# Patient Record
Sex: Female | Born: 1970 | Race: White | Hispanic: No | Marital: Married | Smoking: Never smoker
Health system: Southern US, Community
[De-identification: ages and names within clinical notes are randomized; demographics above are authoritative.]

## PROBLEM LIST (undated history)

## (undated) DIAGNOSIS — B029 Zoster without complications: Secondary | ICD-10-CM

## (undated) DIAGNOSIS — F32A Depression, unspecified: Secondary | ICD-10-CM

## (undated) DIAGNOSIS — L511 Stevens-Johnson syndrome: Secondary | ICD-10-CM

## (undated) DIAGNOSIS — J45909 Unspecified asthma, uncomplicated: Secondary | ICD-10-CM

## (undated) DIAGNOSIS — T7840XA Allergy, unspecified, initial encounter: Secondary | ICD-10-CM

## (undated) DIAGNOSIS — M419 Scoliosis, unspecified: Secondary | ICD-10-CM

## (undated) DIAGNOSIS — F329 Major depressive disorder, single episode, unspecified: Secondary | ICD-10-CM

## (undated) DIAGNOSIS — F419 Anxiety disorder, unspecified: Secondary | ICD-10-CM

## (undated) HISTORY — PX: EYE SURGERY: SHX253

## (undated) HISTORY — PX: CHOLECYSTECTOMY: SHX55

## (undated) HISTORY — PX: OTHER SURGICAL HISTORY: SHX169

## (undated) HISTORY — PX: WISDOM TOOTH EXTRACTION: SHX21

## (undated) HISTORY — DX: Anxiety disorder, unspecified: F41.9

## (undated) HISTORY — DX: Scoliosis, unspecified: M41.9

## (undated) HISTORY — DX: Major depressive disorder, single episode, unspecified: F32.9

## (undated) HISTORY — DX: Allergy, unspecified, initial encounter: T78.40XA

## (undated) HISTORY — DX: Unspecified asthma, uncomplicated: J45.909

## (undated) HISTORY — DX: Stevens-Johnson syndrome: L51.1

## (undated) HISTORY — DX: Zoster without complications: B02.9

## (undated) HISTORY — DX: Depression, unspecified: F32.A

---

## 2015-04-06 ENCOUNTER — Ambulatory Visit (INDEPENDENT_AMBULATORY_CARE_PROVIDER_SITE_OTHER): Payer: 59 | Admitting: Family Medicine

## 2015-04-06 ENCOUNTER — Ambulatory Visit: Payer: Self-pay | Admitting: Internal Medicine

## 2015-04-06 ENCOUNTER — Ambulatory Visit (INDEPENDENT_AMBULATORY_CARE_PROVIDER_SITE_OTHER): Payer: 59

## 2015-04-06 ENCOUNTER — Encounter: Payer: Self-pay | Admitting: Family Medicine

## 2015-04-06 VITALS — BP 130/80 | HR 91 | Temp 98.5°F | Resp 16 | Ht 66.5 in | Wt 250.6 lb

## 2015-04-06 DIAGNOSIS — M2550 Pain in unspecified joint: Secondary | ICD-10-CM | POA: Diagnosis not present

## 2015-04-06 DIAGNOSIS — N921 Excessive and frequent menstruation with irregular cycle: Secondary | ICD-10-CM | POA: Diagnosis not present

## 2015-04-06 DIAGNOSIS — R0781 Pleurodynia: Secondary | ICD-10-CM | POA: Diagnosis not present

## 2015-04-06 DIAGNOSIS — R05 Cough: Secondary | ICD-10-CM

## 2015-04-06 DIAGNOSIS — F411 Generalized anxiety disorder: Secondary | ICD-10-CM | POA: Diagnosis not present

## 2015-04-06 DIAGNOSIS — R059 Cough, unspecified: Secondary | ICD-10-CM

## 2015-04-06 DIAGNOSIS — D899 Disorder involving the immune mechanism, unspecified: Secondary | ICD-10-CM

## 2015-04-06 DIAGNOSIS — J069 Acute upper respiratory infection, unspecified: Secondary | ICD-10-CM | POA: Diagnosis not present

## 2015-04-06 DIAGNOSIS — M609 Myositis, unspecified: Secondary | ICD-10-CM | POA: Diagnosis not present

## 2015-04-06 DIAGNOSIS — J4521 Mild intermittent asthma with (acute) exacerbation: Secondary | ICD-10-CM | POA: Diagnosis not present

## 2015-04-06 DIAGNOSIS — IMO0001 Reserved for inherently not codable concepts without codable children: Secondary | ICD-10-CM

## 2015-04-06 DIAGNOSIS — R21 Rash and other nonspecific skin eruption: Secondary | ICD-10-CM

## 2015-04-06 DIAGNOSIS — M791 Myalgia: Secondary | ICD-10-CM | POA: Diagnosis not present

## 2015-04-06 DIAGNOSIS — N920 Excessive and frequent menstruation with regular cycle: Secondary | ICD-10-CM | POA: Diagnosis not present

## 2015-04-06 DIAGNOSIS — R5382 Chronic fatigue, unspecified: Secondary | ICD-10-CM

## 2015-04-06 LAB — COMPREHENSIVE METABOLIC PANEL
ALBUMIN: 4.3 g/dL (ref 3.6–5.1)
ALT: 19 U/L (ref 6–29)
AST: 16 U/L (ref 10–30)
Alkaline Phosphatase: 77 U/L (ref 33–115)
BUN: 9 mg/dL (ref 7–25)
CALCIUM: 9 mg/dL (ref 8.6–10.2)
CHLORIDE: 105 mmol/L (ref 98–110)
CO2: 25 mmol/L (ref 20–31)
Creat: 0.59 mg/dL (ref 0.50–1.10)
Glucose, Bld: 94 mg/dL (ref 65–99)
POTASSIUM: 4.6 mmol/L (ref 3.5–5.3)
SODIUM: 138 mmol/L (ref 135–146)
Total Bilirubin: 0.4 mg/dL (ref 0.2–1.2)
Total Protein: 6.9 g/dL (ref 6.1–8.1)

## 2015-04-06 LAB — RHEUMATOID FACTOR

## 2015-04-06 LAB — C-REACTIVE PROTEIN: CRP: 2.1 mg/dL — ABNORMAL HIGH (ref <0.60)

## 2015-04-06 LAB — CBC WITH DIFFERENTIAL/PLATELET
BASOS ABS: 0.1 10*3/uL (ref 0.0–0.1)
BASOS PCT: 1 % (ref 0–1)
EOS ABS: 0.1 10*3/uL (ref 0.0–0.7)
EOS PCT: 1 % (ref 0–5)
HCT: 38.5 % (ref 36.0–46.0)
Hemoglobin: 12.5 g/dL (ref 12.0–15.0)
Lymphocytes Relative: 25 % (ref 12–46)
Lymphs Abs: 2.1 10*3/uL (ref 0.7–4.0)
MCH: 28.5 pg (ref 26.0–34.0)
MCHC: 32.5 g/dL (ref 30.0–36.0)
MCV: 87.7 fL (ref 78.0–100.0)
MONOS PCT: 5 % (ref 3–12)
MPV: 8.9 fL (ref 8.6–12.4)
Monocytes Absolute: 0.4 10*3/uL (ref 0.1–1.0)
NEUTROS PCT: 68 % (ref 43–77)
Neutro Abs: 5.6 10*3/uL (ref 1.7–7.7)
PLATELETS: 534 10*3/uL — AB (ref 150–400)
RBC: 4.39 MIL/uL (ref 3.87–5.11)
RDW: 14.5 % (ref 11.5–15.5)
WBC: 8.2 10*3/uL (ref 4.0–10.5)

## 2015-04-06 LAB — CK: Total CK: 46 U/L (ref 7–177)

## 2015-04-06 LAB — THYROID PANEL WITH TSH
Free Thyroxine Index: 2.1 (ref 1.4–3.8)
T3 Uptake: 29 % (ref 22–35)
T4, Total: 7.2 ug/dL (ref 4.5–12.0)
TSH: 1.414 u[IU]/mL (ref 0.350–4.500)

## 2015-04-06 LAB — URIC ACID: URIC ACID, SERUM: 4.1 mg/dL (ref 2.4–7.0)

## 2015-04-06 LAB — LIPID PANEL
Cholesterol: 109 mg/dL — ABNORMAL LOW (ref 125–200)
HDL: 51 mg/dL (ref >=46)
LDL CALC: 33 mg/dL (ref <130)
Total CHOL/HDL Ratio: 2.1 Ratio (ref <=5.0)
Triglycerides: 125 mg/dL (ref <150)
VLDL: 25 mg/dL (ref <30)

## 2015-04-06 LAB — LACTATE DEHYDROGENASE: LDH: 83 U/L — ABNORMAL LOW (ref 94–250)

## 2015-04-06 LAB — IRON: Iron: 91 ug/dL (ref 40–190)

## 2015-04-06 LAB — VITAMIN B12: Vitamin B-12: 434 pg/mL (ref 211–911)

## 2015-04-06 LAB — FERRITIN: FERRITIN: 26 ng/mL (ref 10–291)

## 2015-04-06 MED ORDER — ESCITALOPRAM OXALATE 20 MG PO TABS: 20.0000 mg | ORAL_TABLET | Freq: Every day | ORAL | Status: DC

## 2015-04-06 MED ORDER — AEROCHAMBER PLUS MISC: Status: DC

## 2015-04-06 MED ORDER — AZITHROMYCIN 250 MG PO TABS: ORAL_TABLET | ORAL | Status: DC

## 2015-04-06 MED ORDER — ACETAMINOPHEN-CODEINE #3 300-30 MG PO TABS: 1.0000 | ORAL_TABLET | ORAL | Status: DC | PRN

## 2015-04-06 MED ORDER — BUDESONIDE-FORMOTEROL FUMARATE 160-4.5 MCG/ACT IN AERO: 1.0000 | INHALATION_SPRAY | Freq: Two times a day (BID) | RESPIRATORY_TRACT | Status: DC

## 2015-04-06 MED ORDER — ALBUTEROL SULFATE HFA 108 (90 BASE) MCG/ACT IN AERS: 2.0000 | INHALATION_SPRAY | RESPIRATORY_TRACT | Status: DC | PRN

## 2015-04-06 MED ORDER — BENZONATATE 200 MG PO CAPS: 200.0000 mg | ORAL_CAPSULE | Freq: Three times a day (TID) | ORAL | Status: DC | PRN

## 2015-04-06 NOTE — Patient Instructions (Addendum)
Because you received an x-ray today, you will receive an invoice from [facility]. Please contact [facility] at [phone] with questions or concerns regarding your invoice. Our billing staff will not be able to assist you with those questions.  If you get any odd rashes, do let me see. Lets hold off on the prednisone since you have an upcoming appointment with ENT - start the zpack but if you are still coughing and feel tight, call and we can to a lower dose prednisone burst.

## 2015-04-06 NOTE — Progress Notes (Signed)
Subjective:    Patient ID: Mia Thomas, female    DOB: [birth year], 45 y.o.   MRN: [id number] Chief Complaint  Patient presents with  . new pt, estab care  . Asthma    HPI  Mia Thomas is an absolutely delightful 45 yo woman here today to est care.  She moved to [place] with her family about 6 mos ago from [place] - her husband is a GI doctor that has started with [facility].    For the past several months, since around 12/15, she has only had a total of 2 weeks of feeling well - recurrent URIs with cough- which is unfortunately not uncommon for her during the winter.  Initially her sxs started with severe pharyngitis and laryngitis initially, no fevers.  Now for the past sev weeks she has had worsening sensation of swelling in her throat - feels very tight and inducing a dry upper airway hacking cough.  Albuterol has not helped at all so not using.  Coughing so much she feels like she broke a rib which has actually happened to her prev.  Cough keeping her awake so hasn't had a good night sleep in 6 wks. Did have one tylenol #3 left which she took before bed sev nights ago and did sleep much better with reduced o/n cough with that.  She does have a h/o asthma/bronchospams that flairs with URIs only.  Between episodes of acute illness, she is completely asymptomatic from her asthma and req no inhaler use.  She uses symbicort but does not feel any difference at all or reduction in sxs when on it. Ran out several wks prior and cough has not worsened since then - just persisted. Has never been on any other type of maintenance inhaler.  Tries to avoid systemic steroid treatment possible as does sig worsen her anxiety, irritability, insomnia.  Singulair does help so takes regularly, cetirizine and other antihistamines don't help.  Did have prior prolonged episode like this 2 years ago as well - finally sought treatment and quickly responded to a zpack.  There was a concern at that time that she could have had  pertussis despite tdap fairly recently prior (1-2 yrs). Req new spacer with alb refill. Has neb machine at home with plenty of alb solution. Son has more severe asthma.  Over the summer during the move she had an outbreak of bilateral shingles over T7-8 dermatome areas treated with valtrex but bounced between either side for a while . She has had similar shingles episodes sev times prior as well.  Has felt like she has always had a bad immune system as she gets URIs many times each year and very atopic.    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  Her husband has alwasys suspected that she may have some sort of immunodeficiency such as IgA def or an odd mixed connective tissue d/o but she has never pursued actual w/u of this. Pt and her husband both state mult times that she "hates going to the doctor" and so things have to pretty severe before she will seek treatment.  She has never seen ENT but does have an appt with Dr Janace Hoard at [facility] next wk as her husband is concerned that she could be having laryngeal irritation chronically - poss from childhood intubation?  Many times her cough seems to come more from  her throat rather than asthma/bronchospasm/bronchitis flair.   Mia Thomas admits that she has seemed to havea variety of rheumatologic symptoms throughout life that have become progressively more freq.  She will often wake up with the joints in her fingers swollen, red, stiff.  After her prolonged bilateral shingles outbreak resolved 4-6 mos ago, she then developed severe weakness and pain in bilateral proximal upper ext - couldn't abduct her arms past 75 deg for several days.  Throughout life she has been prone to a variety of rashes - -several years ago she had a circular rash than ran linearly around her abdomen - looked like a chain-belt - saw dermatology who didn't know  what it was - biopsy was done that showed some non-specific inflammatory change.  Gets an intermittent leathery red scaly rash at different places - currently has a small patch on her right lateral ankle and is sometimes connected with an intense neuropathic pain.  When she gets sick she will get pustules pop out randomly over body - assumes in MRSA - has never tried anything for it but would like to.  Her daughter Mia Thomas had to see immunologist and be revaccinated for ALL of her childhood vaccines as an adolescent as she did not have significant antibody levels for ANYTHING.  Jenna's mom has recently been diagnosed with dermatomyositis - fortunately it is very well controlled with diet/lifestyle and hasn't required medication treatment.    Does take a lot of advil for her joint pains but no other otc meds, supps, herbals, vit.  No gerd/Gi problems.  Does better when she swims for exercise.  Menses are terrible. Small uterine cyst.  IUD went horribly and made sxs  Much worse  Frustrated by weight gain despite very healthy diet.  Recurrent illnesses and cough has kept her from doing appropriate self-care like sleep and exercise.  Eats a 5 oz tub of baby spinach DAILY - puts it in EVERYTHING - and eats a ton of kale.  Plenty of beans and cereal. No red meat as primarily vegetarian but just added back in fish.  Has 3 kids and 3 dogs. Her eldest son 48 yo is in college. Daughter is at [employer] of science and math - 78 yo. Youngest son - 29 yo- is still at home - I met him prior during a rather severe asthma exac and he has an appointment with me to est in sev wks as well.   Used to be a Oncologist.  Past Medical History  Diagnosis Date  . Allergy   . Anxiety   . Asthma   . Depression    Past Surgical History  Procedure Laterality Date  . Cholecystectomy    . Cesarean section    . Knee arthroscopy x 2    . Eye surgery      repair corneal tare   No current outpatient prescriptions  on file prior to visit.   No current facility-administered medications on file prior to visit.   Allergies  Allergen Reactions  . Sulfa Antibiotics     Remo Lipps Johnson"s syndrome   Family History  Problem Relation Age of Onset  . Cancer Mother     skin  . Cancer Father     skin   Social History   Social History  . Marital Status: Married    Spouse Name: N/A  . Number of Children: N/A  . Years of Education: N/A   Social History Main Topics  . Smoking status: Never Smoker   .  Smokeless tobacco: None  . Alcohol Use: No  . Drug Use: No  . Sexual Activity: Not Asked   Other Topics Concern  . None   Social History Narrative  . None     Review of Systems  Constitutional: Positive for activity change, fatigue and unexpected weight change. Negative for fever, chills, diaphoresis and appetite change.  HENT: Positive for voice change. Negative for congestion, ear discharge, ear pain, mouth sores, nosebleeds, postnasal drip, rhinorrhea, sinus pressure, sneezing, sore throat and trouble swallowing.   Eyes: Positive for photophobia. Negative for pain.  Respiratory: Positive for cough and stridor. Negative for choking, chest tightness, shortness of breath and wheezing.   Cardiovascular: Negative for chest pain and leg swelling.  Gastrointestinal: Negative for nausea, vomiting and abdominal pain.  Genitourinary: Positive for vaginal bleeding, menstrual problem and pelvic pain. Negative for dysuria and difficulty urinating.  Musculoskeletal: Positive for myalgias, joint swelling and arthralgias. Negative for gait problem, neck pain and neck stiffness.  Skin: Positive for rash.  Allergic/Immunologic: Positive for environmental allergies and immunocompromised state. Negative for food allergies.  Neurological: Positive for weakness. Negative for dizziness and syncope.  Hematological: Positive for adenopathy.  Psychiatric/Behavioral: Positive for sleep disturbance.       Objective:    BP 130/80 mmHg  Pulse 91  Temp(Src) 98.5 F (36.9 C) (Oral)  Resp 16  Ht 5' 6.5" (1.689 m)  Wt 250 lb 9.6 oz (113.671 kg)  BMI 39.85 kg/m2  SpO2 98%  LMP 03/19/2015  Physical Exam  Constitutional: She is oriented to person, place, and time. She appears well-developed and well-nourished. No distress.  HENT:  Head: Normocephalic and atraumatic.  Right Ear: External ear and ear canal normal. Tympanic membrane is retracted.  Left Ear: External ear and ear canal normal. Tympanic membrane is retracted.  Nose: Nose normal. No mucosal edema or rhinorrhea.  Mouth/Throat: Uvula is midline and mucous membranes are normal. Posterior oropharyngeal erythema present. No oropharyngeal exudate or posterior oropharyngeal edema.  Eyes: Conjunctivae are normal. Right eye exhibits no discharge. Left eye exhibits no discharge. No scleral icterus.  Neck: Normal range of motion. Neck supple.  Cardiovascular: Normal rate, regular rhythm, normal heart sounds and intact distal pulses.   Pulmonary/Chest: Effort normal and breath sounds normal. She exhibits tenderness and bony tenderness.    Abdominal: Soft. Bowel sounds are normal. She exhibits no distension and no mass. There is no tenderness. There is no rebound and no guarding.  Lymphadenopathy:       Head (right side): No submandibular, no preauricular and no posterior auricular adenopathy present.       Head (left side): No submandibular, no preauricular and no posterior auricular adenopathy present.    She has cervical adenopathy.       Right cervical: Superficial cervical adenopathy present. No posterior cervical adenopathy present.      Left cervical: Superficial cervical adenopathy present. No posterior cervical adenopathy present.       Right: No supraclavicular adenopathy present.       Left: No supraclavicular adenopathy present.  Neurological: She is alert and oriented to person, place, and time.  Skin: Skin is warm and dry. No rash noted. She  is not diaphoretic. No erythema.  Psychiatric: She has a normal mood and affect. Her behavior is normal.      Dg Ribs Unilateral W/chest Right  04/06/2015  CLINICAL DATA:  Right-sided rib pain after coughing for 2 months. EXAM: RIGHT RIBS AND CHEST - 3+ VIEW COMPARISON:  None.  FINDINGS: No fracture or other bone lesions are seen involving the ribs. There is no evidence of pneumothorax or pleural effusion. Both lungs are clear. Heart size and mediastinal contours are within normal limits. IMPRESSION: Normal right ribs.  No acute cardiopulmonary abnormality seen. Electronically Signed   By: Marijo Conception, M.D.   On: 04/06/2015 12:01       Assessment & Plan:   1. Acute upper respiratory infection - has had prolonged illness with secondary (or maybe even tertiary or quaternary) worsening so will treat with zpack  2. Rib pain on right side - no fracture seen but clearly with severe pain - brace with coughing and deep breathing, exquisitely tender so prn tylenol #3.  3. Asthma, chronic, mild intermittent, with acute exacerbation - current asthma not exac but do have low threshhold for addingon low dose prednisone should sxs persists.  Cont singulair, not currently needing symbicort - refilled but encouraged pt to consider changing to another product if she does not find she is getting any current sig benefit from symbicort.  4. Myalgia and myositis   5. Cough   6. Chronic fatigue   7. Anxiety state - does well on lexapro, rarely uses prn ativan - declines refill today  8. 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 Very odd that none of her daughter's childhood vaccines induced adequate response. Suspect dermatomyositis since was recently diagnosed in her mother.  Will start detailed lab w/u and will likely need immunology or  rheumatology referral - will try Dr. Estanislado Pandy.  9. Rash and nonspecific skin eruption - use nasal bacroban and daily hibiclens body wash x 5d to attempt mrsa eradication  10. Arthralgia   11. Menometrorrhagia     Orders Placed This Encounter  Procedures  . DG Ribs Unilateral W/Chest Right    Standing Status: Future     Number of Occurrences: 1     Standing Expiration Date: 04/05/2016    Order Specific Question:  Reason for Exam (SYMPTOM  OR DIAGNOSIS REQUIRED)    Answer:  anterior right rib pain after cough x 2 mos    Order Specific Question:  Is the patient pregnant?    Answer:  No    Order Specific Question:  Preferred imaging location?    Answer:  External  . C-reactive protein  . Comprehensive metabolic panel  . CK  . ANA  . IgG, IgA, IgM  . IgE  . Rheumatoid factor  . Uric Acid  . CBC with Differential/Platelet  . Sedimentation Rate  . Pathologist smear review  . Vitamin B12  . VITAMIN D 25 Hydroxy (Vit-D Deficiency, Fractures)  . Thyroid Panel With TSH  . Lipid panel    Order Specific Question:  Has the patient fasted?    Answer:  Yes  . Ferritin  . Iron  . Aldolase  . Lactate Dehydrogenase    Meds ordered this encounter  Medications  . DISCONTD: escitalopram (LEXAPRO) 20 MG tablet    Sig: Take 20 mg by mouth daily.  . montelukast (SINGULAIR) 10 MG tablet    Sig: Take 10 mg by mouth at bedtime.  Marland Kitchen escitalopram (LEXAPRO) 20 MG tablet    Sig: Take 1 tablet (20 mg total) by mouth daily.    Dispense:  90 tablet    Refill:  3  . LORazepam (ATIVAN) 1 MG  tablet    Sig: Take 1 mg by mouth every 8 (eight) hours.  Marland Kitchen albuterol (PROVENTIL HFA;VENTOLIN HFA) 108 (90 Base) MCG/ACT inhaler    Sig: Inhale 2 puffs into the lungs every 4 (four) hours as needed for wheezing or shortness of breath (cough, shortness of breath or wheezing.).    Dispense:  1 Inhaler    Refill:  11  . Spacer/Aero-Holding Chambers (AEROCHAMBER PLUS) inhaler    Sig: Use as instructed    Dispense:   1 each    Refill:  2    Please dispense any type of spacer that work with the albuterol that pt is being prescribed  . azithromycin (ZITHROMAX) 250 MG tablet    Sig: Take 2 tabs PO x 1 dose, then 1 tab PO QD x 4 days    Dispense:  6 tablet    Refill:  0  . acetaminophen-codeine (TYLENOL #3) 300-30 MG tablet    Sig: Take 1 tablet by mouth every 4 (four) hours as needed for moderate pain (or cough).    Dispense:  60 tablet    Refill:  0  . budesonide-formoterol (SYMBICORT) 160-4.5 MCG/ACT inhaler    Sig: Inhale 1 puff into the lungs 2 (two) times daily.    Dispense:  1 Inhaler    Refill:  3  . benzonatate (TESSALON) 200 MG capsule    Sig: Take 1 capsule (200 mg total) by mouth 3 (three) times daily as needed for cough.    Dispense:  60 capsule    Refill:  1   Over 45 min spent in face-to-face evaluation of and consultation with patient and coordination of care.  Over 50% of this time was spent counseling this patient.  Delman Cheadle, MD MPH

## 2015-04-07 LAB — VITAMIN D 25 HYDROXY (VIT D DEFICIENCY, FRACTURES): Vit D, 25-Hydroxy: 32 ng/mL (ref 30–100)

## 2015-04-07 LAB — IGG, IGA, IGM
IgA: 316 mg/dL (ref 69–380)
IgG (Immunoglobin G), Serum: 1010 mg/dL (ref 690–1700)
IgM, Serum: 96 mg/dL (ref 52–322)

## 2015-04-07 LAB — SEDIMENTATION RATE: SED RATE: 18 mm/h (ref 0–20)

## 2015-04-07 LAB — PATHOLOGIST SMEAR REVIEW

## 2015-04-07 LAB — ANA: ANA: NEGATIVE

## 2015-04-07 LAB — IGE: IgE (Immunoglobulin E), Serum: 39 kU/L (ref <115)

## 2015-04-08 MED ORDER — MUPIROCIN 2 % EX OINT: TOPICAL_OINTMENT | CUTANEOUS | Status: DC

## 2015-04-08 MED ORDER — CHLORHEXIDINE GLUCONATE 4 % EX LIQD: CUTANEOUS | Status: DC

## 2015-04-10 ENCOUNTER — Encounter: Payer: Self-pay | Admitting: Family Medicine

## 2015-04-10 DIAGNOSIS — R490 Dysphonia: Secondary | ICD-10-CM | POA: Diagnosis not present

## 2015-04-10 DIAGNOSIS — R061 Stridor: Secondary | ICD-10-CM | POA: Diagnosis not present

## 2015-04-10 DIAGNOSIS — K219 Gastro-esophageal reflux disease without esophagitis: Secondary | ICD-10-CM | POA: Diagnosis not present

## 2015-04-10 LAB — ALDOLASE: ALDOLASE: 4.3 U/L (ref <=8.1)

## 2015-04-13 ENCOUNTER — Telehealth: Payer: Self-pay

## 2015-04-20 ENCOUNTER — Other Ambulatory Visit: Payer: Self-pay | Admitting: Family Medicine

## 2015-04-20 MED ORDER — ESCITALOPRAM OXALATE 20 MG PO TABS: 20.0000 mg | ORAL_TABLET | Freq: Every day | ORAL | Status: DC

## 2015-04-20 MED ORDER — BUDESONIDE-FORMOTEROL FUMARATE 160-4.5 MCG/ACT IN AERO: 1.0000 | INHALATION_SPRAY | Freq: Two times a day (BID) | RESPIRATORY_TRACT | Status: DC

## 2015-05-29 ENCOUNTER — Other Ambulatory Visit: Payer: Self-pay | Admitting: Family Medicine

## 2015-05-29 MED ORDER — MONTELUKAST SODIUM 10 MG PO TABS: 10.0000 mg | ORAL_TABLET | Freq: Every day | ORAL | Status: DC

## 2015-09-06 ENCOUNTER — Telehealth: Payer: Self-pay | Admitting: Family Medicine

## 2015-09-06 NOTE — Telephone Encounter (Signed)
Apparently, [employer] insurance did not cover any of the labs done at pt's OV in February as they said the linked codes were not correct.  I would love to rectify this situation - as you see I have a plethora of diagnosis codes, as well as good medical reason, so figure that there must be some way of figuring this out.  Any recommendations for how to proceed?  Should I look in the medicare coverage guide to ensure they are covered in there? Any help would be [facility] appreciated - thanks. Harmon Pier

## 2015-09-21 NOTE — Telephone Encounter (Signed)
I will call to check this out when I get back into the office on Tuesday to see what is going on.

## 2015-10-02 NOTE — Telephone Encounter (Signed)
Ccan you please check into this? Thanks!

## 2015-10-05 DIAGNOSIS — M71341 Other bursal cyst, right hand: Secondary | ICD-10-CM | POA: Diagnosis not present

## 2015-10-05 DIAGNOSIS — D485 Neoplasm of uncertain behavior of skin: Secondary | ICD-10-CM | POA: Diagnosis not present

## 2015-10-05 DIAGNOSIS — D2272 Melanocytic nevi of left lower limb, including hip: Secondary | ICD-10-CM | POA: Diagnosis not present

## 2015-10-05 DIAGNOSIS — D2361 Other benign neoplasm of skin of right upper limb, including shoulder: Secondary | ICD-10-CM | POA: Diagnosis not present

## 2015-10-05 NOTE — Telephone Encounter (Signed)
I called [facility] and they said that insurance is pending on that date.  She has like 5 claims on that day.  I'm not sure what she is talking about.  She could be looking at her EOB which looks like a bill but is not.

## 2016-01-08 ENCOUNTER — Telehealth: Payer: Self-pay

## 2016-01-08 ENCOUNTER — Ambulatory Visit (INDEPENDENT_AMBULATORY_CARE_PROVIDER_SITE_OTHER): Payer: 59 | Admitting: Physician Assistant

## 2016-01-08 DIAGNOSIS — J45901 Unspecified asthma with (acute) exacerbation: Secondary | ICD-10-CM | POA: Diagnosis not present

## 2016-01-08 DIAGNOSIS — J309 Allergic rhinitis, unspecified: Secondary | ICD-10-CM | POA: Diagnosis not present

## 2016-01-08 DIAGNOSIS — J45909 Unspecified asthma, uncomplicated: Secondary | ICD-10-CM | POA: Insufficient documentation

## 2016-01-08 MED ORDER — HYDROCOD POLST-CPM POLST ER 10-8 MG/5ML PO SUER: 5.0000 mL | Freq: Two times a day (BID) | ORAL | 0 refills | Status: DC | PRN

## 2016-01-08 MED ORDER — AZITHROMYCIN 250 MG PO TABS: ORAL_TABLET | ORAL | 0 refills | Status: DC

## 2016-01-08 MED ORDER — PREDNISONE 20 MG PO TABS: ORAL_TABLET | ORAL | 0 refills | Status: DC

## 2016-01-08 MED ORDER — BENZONATATE 100 MG PO CAPS: 100.0000 mg | ORAL_CAPSULE | Freq: Three times a day (TID) | ORAL | 0 refills | Status: DC | PRN

## 2016-01-08 MED ORDER — BUDESONIDE 0.5 MG/2ML IN SUSP: 0.5000 mg | Freq: Two times a day (BID) | RESPIRATORY_TRACT | 0 refills | Status: DC | PRN

## 2016-01-08 MED ORDER — BUDESONIDE 0.25 MG/2ML IN SUSP: 0.2500 mg | Freq: Two times a day (BID) | RESPIRATORY_TRACT | 1 refills | Status: DC

## 2016-01-08 MED FILL — predniSONE 20 MG TABS: 20 | 9 days supply | Qty: 18 | Fill #0

## 2016-01-08 MED FILL — BENZONATATE 100 MG CAPSULE: 100 | 6 days supply | Qty: 40 | Fill #0

## 2016-01-08 MED FILL — AZITHROMYCIN 250 MG TABLET: 250 | 5 days supply | Qty: 6 | Fill #0

## 2016-01-08 NOTE — Telephone Encounter (Signed)
Patient is requesting .5mg  pulmicort since the pharmacy only has .5mg  in stock.  Please advise  [phone]

## 2016-01-08 NOTE — Progress Notes (Signed)
Subjective:    Patient ID: Mia Thomas, female    DOB: [birth year], 45 y.o.   MRN: [id number]  Chief Complaint  Patient presents with  . Asthma    X 2 Weeks    HPI: Presents for asthma flare which started 2 weeks ago. Associated symptoms include congestion, PND, cough, chest tightness, SOB, and wheezing. She states she always gets an asthma flare around this time of year ever since they moved to [facility] from [place], especially with all the large trees around their current house. States she has been using Zyrtec, Mucinex DM, Symbicort, Singulair, and Flonase at home with some relief but over the past 3 days her symptoms have been worsening and she has had some low-grade fevers (highest of 100.3) along with non-stop coughing and increasing wheezing. Coughing is much worse at night and prevents her from sleeping well. States she has had to use her nebulizer 3x in the past 2 weeks which is much more than she usually does. Notes 1 episode of non-bloody emesis during the night from coughing so much. Notes some associated headaches for which she has been taking Advil and Tylenol and they are pretty well-controlled with those medications. Denies ear pain/discharge, eye redness/itching/dicharge, sinus pressure, sore throat, or sneezing. States she has not yet had the flu vaccine. Daughter in highschool recently diagnosed with Pertussis.    Review of Systems  HENT: Positive for congestion and postnasal drip. Negative for ear discharge, ear pain, sinus pain, sinus pressure, sneezing, sore throat, tinnitus and trouble swallowing.   Eyes: Negative for pain, discharge, redness and itching.  Respiratory: Positive for cough, chest tightness, shortness of breath and wheezing. Negative for apnea and choking.   Cardiovascular: Negative for chest pain and palpitations.  Gastrointestinal: Positive for vomiting. Negative for abdominal pain, blood in stool, constipation, diarrhea and nausea.  Genitourinary: Negative  for dysuria, frequency, hematuria and urgency.  Musculoskeletal: Negative for joint swelling and myalgias.  Skin: Negative for rash.  Neurological: Positive for headaches. Negative for dizziness, syncope, weakness and light-headedness.  Hematological: Negative for adenopathy.  Psychiatric/Behavioral: Negative for dysphoric mood.   Allergies  Allergen Reactions  . Sulfa Antibiotics     Remo Lipps Johnson"s syndrome   Prior to Admission medications   Medication Sig Start Date End Date Taking? Authorizing Provider  albuterol (PROVENTIL HFA;VENTOLIN HFA) 108 (90 Base) MCG/ACT inhaler Inhale 2 puffs into the lungs every 4 (four) hours as needed for wheezing or shortness of breath (cough, shortness of breath or wheezing.). 04/06/15  Yes Shawnee Knapp, MD  budesonide-formoterol [facility]) 160-4.5 MCG/ACT inhaler Inhale 1 puff into the lungs 2 (two) times daily. 04/20/15  Yes Shawnee Knapp, MD  cetirizine (ZYRTEC) 10 MG tablet Take 10 mg by mouth daily.   Yes Historical Provider, MD  escitalopram (LEXAPRO) 20 MG tablet Take 1 tablet (20 mg total) by mouth daily. 04/20/15  Yes Shawnee Knapp, MD  fluticasone (FLONASE) 50 MCG/ACT nasal spray Place 1 spray into both nostrils daily.   Yes Historical Provider, MD  montelukast (SINGULAIR) 10 MG tablet Take 1 tablet (10 mg total) by mouth at bedtime. 05/29/15  Yes Shawnee Knapp, MD  Spacer/Aero-Holding Chambers (AEROCHAMBER PLUS) inhaler Use as instructed 04/06/15  Yes Shawnee Knapp, MD      Objective:   Physical Exam  Constitutional: She is oriented to person, place, and time. She appears well-developed and well-nourished.  HENT:  Head: Normocephalic and atraumatic.  Right Ear: External ear normal. No drainage, swelling or  tenderness. Tympanic membrane is not injected, not scarred, not perforated, not erythematous, not retracted and not bulging. Tympanic membrane mobility is normal. No middle ear effusion. No hemotympanum.  Left Ear: External ear normal. No drainage, swelling  or tenderness. Tympanic membrane is not injected, not scarred, not perforated, not erythematous, not retracted and not bulging. Tympanic membrane mobility is normal.  No middle ear effusion. No hemotympanum.  Nose: No mucosal edema, rhinorrhea, sinus tenderness, septal deviation or nasal septal hematoma. Right sinus exhibits no maxillary sinus tenderness and no frontal sinus tenderness. Left sinus exhibits no maxillary sinus tenderness and no frontal sinus tenderness.  Mouth/Throat: Uvula is midline and oropharynx is clear and moist. Mucous membranes are not pale and not dry. Normal dentition. No oropharyngeal exudate or posterior oropharyngeal erythema.  Eyes: Conjunctivae are normal. Pupils are equal, round, and reactive to light. Right eye exhibits no discharge. Left eye exhibits no discharge. Right conjunctiva is not injected. Left conjunctiva is not injected. No scleral icterus. Right pupil is round and reactive. Left pupil is round and reactive.  Neck: Normal range of motion. Neck supple. No tracheal deviation present.  Cardiovascular: Normal rate, regular rhythm, normal heart sounds and intact distal pulses.  Exam reveals no gallop and no friction rub.   No murmur heard. Pulmonary/Chest: Effort normal and breath sounds normal. No stridor. No respiratory distress. She has no decreased breath sounds. She has no wheezes. She has no rhonchi. She has no rales.  Lymphadenopathy:    She has no cervical adenopathy.  Neurological: She is alert and oriented to person, place, and time.  Skin: Skin is warm and dry. No rash noted. No erythema.  Psychiatric: She has a normal mood and affect. Her behavior is normal.       Assessment & Plan:  1. Asthma with acute exacerbation, unspecified asthma severity, unspecified whether persistent Instructed patient to first use nebulized Budesonide 2 mLs (0.5 mg) BID and if this does not work then to use the Prednisone and then the Azithromycin. Discussed plan with  patient and her husband whom is a physician and they expressed understanding. Rx of Hydrocodone to be used as needed at night for cough as well as Tessalon perles. Advised patient to RTC if symptoms are not improving with medications or worsening. - azithromycin (ZITHROMAX) 250 MG tablet; Take 2 tabs PO x 1 dose, then 1 tab PO QD x 4 days  Dispense: 6 tablet; Refill: 0 - benzonatate (TESSALON) 100 MG capsule; Take 1-2 capsules (100-200 mg total) by mouth 3 (three) times daily as needed for cough.  Dispense: 40 capsule; Refill: 0 - chlorpheniramine-HYDROcodone (TUSSIONEX PENNKINETIC ER) 10-8 MG/5ML SUER; Take 5 mLs by mouth every 12 (twelve) hours as needed for cough.  Dispense: 100 mL; Refill: 0 - predniSONE (DELTASONE) 20 MG tablet; Take 3 PO QAM x3days, 2 PO QAM x3days, 1 PO QAM x3days  Dispense: 18 tablet; Refill: 0 - budesonide (PULMICORT) 0.5 MG/2ML nebulizer solution; Take 2 mLs (0.5 mg total) by nebulization 2 (two) times daily as needed.  Dispense: 120 mL; Refill: 0  2. Chronic allergic rhinitis, unspecified seasonality, unspecified trigger Asthma exacerbation likely due to allergic rhinitis, see above.

## 2016-01-08 NOTE — Progress Notes (Signed)
Patient ID: Mia Thomas, female    DOB: 1970/04/07, 45 y.o.   MRN: [id number]  PCP: No primary care provider on file.  Subjective:   Chief Complaint  Patient presents with  . Asthma    X 2 Weeks     HPI Presents for evaluation of asthma flare. She is accompanied by her husband, [name].  This flare began 2 weeks ago. Typically experiences a flare of her allergies and asthma this time of year, since her family relocated to [facility] from [place].  Her routine medications are providing only minimal relief of her symptoms and she relates low grade fever (100.3), increased "non-stop" coughing (non-productive) and increased wheezing x 3 days. Cough is disrupting her sleep.   Has used albuterol nebs x3 in the past 2 weeks. One episode of post-tussive emesis, but no other nausea or vomiting. Some headache, relieved with OTC NSAIDS. No nasal/dsinus congestion or pressure, no ST, no ear pain, no eye symptoms. One of her 3 children, who attends a residential high school in [place], [place], was recently treated for pertussis. She has not yet received a flu vaccine this season.  In the past, she's been able to avoid needing oral steroids and antibiotics by using nebulized budesonide.  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 8th grader at [employer]: Positive for congestion and postnasal drip. Negative for ear discharge, ear pain, sinus pain, sinus pressure, sneezing, sore throat, tinnitus and trouble swallowing.   Eyes: Negative for pain, discharge, redness and itching.  Respiratory: Positive for cough, chest tightness, shortness of breath and wheezing. Negative for apnea and choking.   Cardiovascular: Negative for chest pain and palpitations.  Gastrointestinal: Positive for vomiting. Negative for abdominal pain, blood in stool, constipation, diarrhea  and nausea.  Genitourinary: Negative for dysuria, frequency, hematuria and urgency.  Musculoskeletal: Negative for joint swelling and myalgias.  Skin: Negative for rash.  Neurological: Positive for headaches. Negative for dizziness, syncope, weakness and light-headedness.  Hematological: Negative for adenopathy.  Psychiatric/Behavioral: Negative for dysphoric mood.     Patient Active Problem List   Diagnosis Date Noted  . 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 Allergic rhinitis 01/08/2016     Prior to Admission medications   Medication Sig Start Date End Date Taking? Authorizing Provider  albuterol (PROVENTIL HFA;VENTOLIN HFA) 108 (90 Base) MCG/ACT inhaler Inhale 2 puffs into the lungs every 4 (four) hours as needed for wheezing or shortness of breath (cough, shortness of breath or wheezing.). 04/06/15  Yes Shawnee Knapp, MD  budesonide-formoterol [facility]) 160-4.5 MCG/ACT inhaler Inhale 1 puff into the lungs 2 (two) times daily. 04/20/15  Yes Shawnee Knapp, MD  cetirizine (ZYRTEC) 10 MG tablet Take 10 mg by mouth daily.   Yes Historical Provider, MD  escitalopram (LEXAPRO) 20 MG tablet Take 1 tablet (20 mg total) by mouth daily. 04/20/15  Yes Shawnee Knapp, MD  fluticasone (FLONASE) 50 MCG/ACT nasal spray Place 1 spray into both nostrils daily.   Yes Historical Provider, MD  montelukast (SINGULAIR) 10 MG tablet Take 1 tablet (10 mg total) by mouth at bedtime. 05/29/15  Yes Shawnee Knapp, MD  Spacer/Aero-Holding Chambers (AEROCHAMBER PLUS) inhaler Use as instructed 04/06/15  Yes Shawnee Knapp, MD     Allergies  Allergen Reactions  . Sulfa Antibiotics     Remo Lipps Johnson"s syndrome  Objective:  Physical Exam  Constitutional: She is oriented to person, place, and time. She appears well-developed and well-nourished. She is active and cooperative. No distress.  BP 132/76 (BP Location: Right Arm, Patient Position: Sitting, Cuff Size: Large)   Pulse 99   Temp 98.2 F (36.8 C) (Oral)   Resp 16   Ht 5'  6" (1.676 m)   Wt 254 lb (115.2 kg)   LMP 01/08/2016 (Exact Date)   SpO2 97%   BMI 41.00 kg/m   HENT:  Head: Normocephalic and atraumatic.  Right Ear: Hearing normal.  Left Ear: Hearing normal.  Eyes: Conjunctivae are normal. No scleral icterus.  Neck: Normal range of motion. Neck supple. No thyromegaly present.  Cardiovascular: Normal rate, regular rhythm and normal heart sounds.   Pulses:      Radial pulses are 2+ on the right side, and 2+ on the left side.  Pulmonary/Chest: Effort normal and breath sounds normal. No respiratory distress. She has no wheezes (she reports that the wheezing is present primarily at night). She has no rales. She exhibits no tenderness.  Lymphadenopathy:       Head (right side): No tonsillar, no preauricular, no posterior auricular and no occipital adenopathy present.       Head (left side): No tonsillar, no preauricular, no posterior auricular and no occipital adenopathy present.    She has no cervical adenopathy.       Right: No supraclavicular adenopathy present.       Left: No supraclavicular adenopathy present.  Neurological: She is alert and oriented to person, place, and time. No sensory deficit.  Skin: Skin is warm, dry and intact. No rash noted. No cyanosis or erythema. Nails show no clubbing.  Psychiatric: She has a normal mood and affect. Her speech is normal and behavior is normal.           Assessment & Plan:   1. Asthma with acute exacerbation, unspecified asthma severity, unspecified whether persistent We'll try budesonide in the nebulizer and if she doesn't improve, she'll fill the Rx for oral prednisone, and if symptoms still persist, she'll proceed with the Zpack (also covers pertussis). Tessalon perles for daytime. Tussionex for HS. - azithromycin (ZITHROMAX) 250 MG tablet; Take 2 tabs PO x 1 dose, then 1 tab PO QD x 4 days  Dispense: 6 tablet; Refill: 0 - benzonatate (TESSALON) 100 MG capsule; Take 1-2 capsules (100-200 mg total)  by mouth 3 (three) times daily as needed for cough.  Dispense: 40 capsule; Refill: 0 - chlorpheniramine-HYDROcodone (TUSSIONEX PENNKINETIC ER) 10-8 MG/5ML SUER; Take 5 mLs by mouth every 12 (twelve) hours as needed for cough.  Dispense: 100 mL; Refill: 0 - predniSONE (DELTASONE) 20 MG tablet; Take 3 PO QAM x3days, 2 PO QAM x3days, 1 PO QAM x3days  Dispense: 18 tablet; Refill: 0 - budesonide (PULMICORT) 0.5 MG/2ML nebulizer solution; Take 2 mLs (0.5 mg total) by nebulization 2 (two) times daily as needed.  Dispense: 120 mL; Refill: 0  2. Chronic allergic rhinitis, unspecified seasonality, unspecified trigger Continue her current regimen. Consider asthma/allergy referral in the future.   Fara Chute, PA-C Physician Assistant-Certified [facility]

## 2016-01-08 NOTE — Patient Instructions (Signed)
     IF you received an x-ray today, you will receive an invoice from [facility]. Please contact [facility] at [phone] with questions or concerns regarding your invoice.   IF you received labwork today, you will receive an invoice from [facility]. Please contact [facility] at [phone] with questions or concerns regarding your invoice.   Our billing staff will not be able to assist you with questions regarding bills from these companies.  You will be contacted with the lab results as soon as they are available. The fastest way to get your results is to activate your My Chart account. Instructions are located on the last page of this paperwork. If you have not heard from us regarding the results in 2 weeks, please contact this office.      

## 2016-01-10 ENCOUNTER — Encounter: Payer: Self-pay | Admitting: Physician Assistant

## 2016-01-11 NOTE — Telephone Encounter (Signed)
Spoke with pharmacist at [facility].  Was sent to mail order. They retrieved it but needed confirmation.  Given

## 2016-01-12 MED FILL — HYDROCODONE-CHLORPHENIRAM S: 10-8 | 10 days supply | Qty: 100 | Fill #0

## 2016-01-17 ENCOUNTER — Other Ambulatory Visit: Payer: Self-pay | Admitting: Physician Assistant

## 2016-01-17 DIAGNOSIS — J45901 Unspecified asthma with (acute) exacerbation: Secondary | ICD-10-CM

## 2016-04-25 ENCOUNTER — Ambulatory Visit: Payer: 59 | Admitting: Family Medicine

## 2016-04-28 ENCOUNTER — Encounter: Payer: Self-pay | Admitting: Family Medicine

## 2016-04-28 DIAGNOSIS — F32A Depression, unspecified: Secondary | ICD-10-CM | POA: Insufficient documentation

## 2016-04-28 DIAGNOSIS — F329 Major depressive disorder, single episode, unspecified: Secondary | ICD-10-CM | POA: Insufficient documentation

## 2016-04-28 DIAGNOSIS — F419 Anxiety disorder, unspecified: Secondary | ICD-10-CM | POA: Insufficient documentation

## 2016-04-28 NOTE — Progress Notes (Signed)
Subjective:    Patient ID: Mia Thomas, female    DOB: 12/23/1970, 46 y.o.   MRN: [id number]  Chief Complaint  Patient presents with  . Medication Refill    Lexapro and Singular  . Cough    Started on Tuesday. Productive.    HPI  Mia Thomas is a delightful 46 yo woman here for medication refills.    5d prior she developed acute illness with URI sxs and led to asthma exacerbation. She finally started a zpack last night.  She got sneezed on 1 wk prior. She has been doing the steroid nebs (tries to avoid oral at all costs, and the albuterol) which has kept her open. Fevers Tmax 102.5 ->103, cough productive now of small green clumps of mucous. No hemoptysis. Finally got some sleep last night - using a mucinex, codeine cough syrup.  Asthma/Allergies: On singulair 10 qhs, - has not been using the flonase qhs, zyrtec 10 qhs normally waits till fall and spring - symbicort 1 puff bid (does not use regularly but started when she became ill), prn albuterol. Also has a neb machine at home with prn pulmicort for worse asthma flairs. Tries to avoid systemic steroid treatment possible as does sig worsen her anxiety, irritability, insomnia.  She uses symbicort but does not feel any difference at all or reduction in sxs when on it.  Singulair does help so takes regularly, cetirizine and other antihistamines don't help. Did she go to Dr. Janace Hoard consultation at [facility] last yr for concern of chronic laryngeal irritation leading to chronic cough - he said it was normal.   Mood d/o: On Lexapro 20mg . Has had a small amount of ativan at home which uses exceedingly rarely (not needed refill for years).  On labs last year ferritin, vitamin D, and B12 were on the low side of normal.  Pt eats vegetarian most of the time (will do some fish) so encouraged her to find ways to get more of these micronutrients in her diet. She is on Juice + daily and so not taking any additional supplements. She had a low LDH so rec  rechecking at a routine visit when she has been at her baseline state of health for some time.  Did she ever get a consult with Dr. Estanislado Pandy?  Did not. Jenn's mother has dermatomyositis which is controlled with diet/lifestyle.  Depression screen [facility] 2/9 04/29/2016 01/08/2016  Decreased Interest 0 0  Down, Depressed, Hopeless 0 0  PHQ - 2 Score 0 0   Past Medical History:  Diagnosis Date  . Allergy   . Anxiety   . Asthma   . Depression   . Shingles    Past Surgical History:  Procedure Laterality Date  . CESAREAN SECTION    . CHOLECYSTECTOMY    . EYE SURGERY     repair corneal tare  . knee arthroscopy x 2     Current Outpatient Prescriptions on File Prior to Visit  Medication Sig Dispense Refill  . albuterol (PROVENTIL HFA;VENTOLIN HFA) 108 (90 Base) MCG/ACT inhaler Inhale 2 puffs into the lungs every 4 (four) hours as needed for wheezing or shortness of breath (cough, shortness of breath or wheezing.). 1 Inhaler 11  . budesonide (PULMICORT) 0.5 MG/2ML nebulizer solution USE 1 VIAL VIA NEBULIZER  TWO TIMES DAILY AS NEEDED 120 mL 0  . budesonide-formoterol (SYMBICORT) 160-4.5 MCG/ACT inhaler Inhale 1 puff into the lungs 2 (two) times daily. 1 Inhaler 3  . cetirizine (ZYRTEC) 10 MG tablet Take  10 mg by mouth daily.    . fluticasone (FLONASE) 50 MCG/ACT nasal spray Place 1 spray into both nostrils daily.    Marland Kitchen Spacer/Aero-Holding Chambers (AEROCHAMBER PLUS) inhaler Use as instructed 1 each 2   No current facility-administered medications on file prior to visit.    Allergies  Allergen Reactions  . Sulfa Antibiotics     Remo Lipps Johnson"s syndrome   Family History  Problem Relation Age of Onset  . Cancer Mother     skin  . Cancer Father     skin  . Heart disease Father    Social History   Social History  . Marital status: Married    Spouse name: Annayah Munz  . Number of children: 3  . Years of education: N/A   Occupational History  . 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 former Oncologist    Social History Main Topics  . Smoking status: Never Smoker  . Smokeless tobacco: Never Used  . Alcohol use No  . Drug use: No  . Sexual activity: Not Asked   Other Topics Concern  . None   Social History Narrative   Lies with her husband, a local GI physician and the youngest of their 3 children.   Originally from [place], and moved to [place] from [place] in 2016.   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 She is considering what else she will do with her time as her children are much less dependent on her.   01/2016: Oldest son at [employer], Facilities manager, daughter 12th grader at [employer], younger son 8th grader at [employer]  See hpi    Objective:   Physical Exam  Constitutional: She is oriented to person, place, and time. She appears well-developed and well-nourished. No distress.  HENT:  Head: Normocephalic and atraumatic.  Right Ear: External ear and ear canal normal. Tympanic membrane is injected and retracted.  Left Ear: External ear and ear canal normal. Tympanic membrane is retracted.  Nose: Rhinorrhea present. No mucosal edema.  Mouth/Throat: Uvula is midline and mucous membranes are normal. Posterior oropharyngeal erythema present. No oropharyngeal exudate or posterior oropharyngeal edema.  Tongue white coating, doesn't scrap off  Eyes: Conjunctivae are normal. Right eye exhibits no discharge. Left eye exhibits no discharge. No scleral icterus.  Neck: Normal range of motion. Neck supple.  Cardiovascular: Normal rate, regular rhythm, normal heart sounds and intact distal pulses.   Pulmonary/Chest: Effort normal and breath sounds normal.  Lymphadenopathy:    She has no cervical adenopathy.  Neurological: She is alert and oriented to person, place, and time.  Skin: Skin is  warm and dry. She is not diaphoretic. No erythema.  Psychiatric: She has a normal mood and affect. Her behavior is normal.         BP (!) 141/89   Pulse 93   Temp 98.2 F (36.8 C) (Oral)   Resp 18   Ht 5\' 6"  (1.676 m)   Wt 246 lb 6.4 oz (111.8 kg)   LMP 04/29/2016   SpO2 95%   BMI 39.77 kg/m   Assessment & Plan:  Need albuterol neb? Cons checking routine labs and inflammatory/rheum tests again. Dermatomyositis???? Labs Did the MRSA eradication attempt last yr decrease freq of furuncles?  1. Intrinsic asthma   2. Chronic allergic rhinitis, unspecified seasonality,  unspecified trigger   3. Recurrent major depressive disorder, in full remission ([place])   4. Anxiety     Meds ordered this encounter  Medications  . nystatin (MYCOSTATIN) 100000 UNIT/ML suspension    Sig: Take 5 mLs (500,000 Units total) by mouth 4 (four) times daily.    Dispense:  180 mL    Refill:  0  . DISCONTD: escitalopram (LEXAPRO) 20 MG tablet    Sig: Take 1 tablet (20 mg total) by mouth daily.    Dispense:  90 tablet    Refill:  3  . DISCONTD: montelukast (SINGULAIR) 10 MG tablet    Sig: Take 1 tablet (10 mg total) by mouth at bedtime.    Dispense:  90 tablet    Refill:  3  . chlorpheniramine-HYDROcodone (TUSSIONEX PENNKINETIC ER) 10-8 MG/5ML SUER    Sig: Take 5 mLs by mouth every 12 (twelve) hours as needed.    Dispense:  120 mL    Refill:  0  . DISCONTD: escitalopram (LEXAPRO) 20 MG tablet    Sig: Take 1 tablet (20 mg total) by mouth daily.    Dispense:  30 tablet    Refill:  0  . escitalopram (LEXAPRO) 20 MG tablet    Sig: Take 1 tablet (20 mg total) by mouth daily.    Dispense:  90 tablet    Refill:  4  . montelukast (SINGULAIR) 10 MG tablet    Sig: Take 1 tablet (10 mg total) by mouth at bedtime.    Dispense:  90 tablet    Refill:  4     Delman Cheadle, M.D.  Primary Care at [facility] [address] [place], [place] [address] [phone] phone [phone] fax  05/31/16  1:13 AM

## 2016-04-29 ENCOUNTER — Encounter: Payer: Self-pay | Admitting: Family Medicine

## 2016-04-29 ENCOUNTER — Ambulatory Visit (INDEPENDENT_AMBULATORY_CARE_PROVIDER_SITE_OTHER): Payer: 59 | Admitting: Family Medicine

## 2016-04-29 VITALS — BP 141/89 | HR 93 | Temp 98.2°F | Resp 18 | Ht 66.0 in | Wt 246.4 lb

## 2016-04-29 DIAGNOSIS — J45909 Unspecified asthma, uncomplicated: Secondary | ICD-10-CM

## 2016-04-29 DIAGNOSIS — J309 Allergic rhinitis, unspecified: Secondary | ICD-10-CM | POA: Diagnosis not present

## 2016-04-29 DIAGNOSIS — F419 Anxiety disorder, unspecified: Secondary | ICD-10-CM

## 2016-04-29 DIAGNOSIS — F3342 Major depressive disorder, recurrent, in full remission: Secondary | ICD-10-CM | POA: Diagnosis not present

## 2016-04-29 MED ORDER — ESCITALOPRAM OXALATE 20 MG PO TABS: 20.0000 mg | ORAL_TABLET | Freq: Every day | ORAL | 3 refills | Status: DC

## 2016-04-29 MED ORDER — ESCITALOPRAM OXALATE 20 MG PO TABS: 20.0000 mg | ORAL_TABLET | Freq: Every day | ORAL | 0 refills | Status: DC

## 2016-04-29 MED ORDER — HYDROCOD POLST-CPM POLST ER 10-8 MG/5ML PO SUER: 5.0000 mL | Freq: Two times a day (BID) | ORAL | 0 refills | Status: DC | PRN

## 2016-04-29 MED ORDER — NYSTATIN 100000 UNIT/ML MT SUSP: 5.0000 mL | Freq: Four times a day (QID) | OROMUCOSAL | 0 refills | Status: DC

## 2016-04-29 MED ORDER — MONTELUKAST SODIUM 10 MG PO TABS: 10.0000 mg | ORAL_TABLET | Freq: Every day | ORAL | 3 refills | Status: DC

## 2016-04-30 MED ORDER — ESCITALOPRAM OXALATE 20 MG PO TABS: 20.0000 mg | ORAL_TABLET | Freq: Every day | ORAL | 4 refills | Status: DC

## 2016-04-30 MED ORDER — MONTELUKAST SODIUM 10 MG PO TABS: 10.0000 mg | ORAL_TABLET | Freq: Every day | ORAL | 4 refills | Status: DC

## 2016-04-30 MED FILL — ESCITALOPRAM 20 MG TABLET: 20 | 90 days supply | Qty: 90 | Fill #0

## 2016-04-30 MED FILL — MONTELUKAST SOD 10 MG TAB: 10 | 90 days supply | Qty: 90 | Fill #0

## 2016-07-30 MED FILL — MONTELUKAST SOD 10 MG TAB: 10 | 90 days supply | Qty: 90 | Fill #1

## 2016-07-30 MED FILL — ESCITALOPRAM 20 MG TABLET: 20 | 90 days supply | Qty: 90 | Fill #1

## 2016-10-09 ENCOUNTER — Encounter: Payer: Self-pay | Admitting: Family Medicine

## 2016-10-09 ENCOUNTER — Other Ambulatory Visit: Payer: Self-pay | Admitting: Family Medicine

## 2016-10-09 ENCOUNTER — Ambulatory Visit (INDEPENDENT_AMBULATORY_CARE_PROVIDER_SITE_OTHER): Payer: 59 | Admitting: Family Medicine

## 2016-10-09 VITALS — BP 124/83 | HR 62 | Temp 98.3°F | Resp 18 | Ht 66.0 in | Wt 242.4 lb

## 2016-10-09 DIAGNOSIS — G4700 Insomnia, unspecified: Secondary | ICD-10-CM

## 2016-10-09 DIAGNOSIS — Z1231 Encounter for screening mammogram for malignant neoplasm of breast: Secondary | ICD-10-CM | POA: Diagnosis not present

## 2016-10-09 DIAGNOSIS — N951 Menopausal and female climacteric states: Secondary | ICD-10-CM

## 2016-10-09 DIAGNOSIS — R300 Dysuria: Secondary | ICD-10-CM

## 2016-10-09 DIAGNOSIS — L680 Hirsutism: Secondary | ICD-10-CM

## 2016-10-09 DIAGNOSIS — F410 Panic disorder [episodic paroxysmal anxiety] without agoraphobia: Secondary | ICD-10-CM | POA: Diagnosis not present

## 2016-10-09 DIAGNOSIS — Z1239 Encounter for other screening for malignant neoplasm of breast: Secondary | ICD-10-CM

## 2016-10-09 NOTE — Patient Instructions (Addendum)
You need to schedule you mammogram.  Please call the breast center at [facility] at [phone] to schedule. Check out Avlimil - herbal blend that increases estrogen levels.     IF you received an x-ray today, you will receive an invoice from [facility]. Please contact [facility] at [phone] with questions or concerns regarding your invoice.   IF you received labwork today, you will receive an invoice from [facility]. Please contact [facility] at [phone] with questions or concerns regarding your invoice.   Our billing staff will not be able to assist you with questions regarding bills from these companies.  You will be contacted with the lab results as soon as they are available. The fastest way to get your results is to activate your My Chart account. Instructions are located on the last page of this paperwork. If you have not heard from [place] regarding the results in 2 weeks, please contact this office.     Many women are interested in trying over-the-counter herbal supplements, which are advertised widely and claim to increase sexual desire and pleasure. Safety and efficacy of these products are unproven, there is minimal regulatory oversight, and they are often costly. However, often 1 out of 3 women will get response to these with minimal side effects.  One such product is a proprietary blend of herbal supplements called Avlimil. Many of the components of Avlimil are aimed at increasing your estrogen levels and so may increase your risk of developing breast cancer.

## 2016-10-11 LAB — COMPREHENSIVE METABOLIC PANEL
ALK PHOS: 68 IU/L (ref 39–117)
ALT: 21 IU/L (ref 0–32)
AST: 19 IU/L (ref 0–40)
Albumin/Globulin Ratio: 1.9 (ref 1.2–2.2)
Albumin: 4.5 g/dL (ref 3.5–5.5)
BUN/Creatinine Ratio: 20 (ref 9–23)
BUN: 14 mg/dL (ref 6–24)
CHLORIDE: 103 mmol/L (ref 96–106)
CO2: 22 mmol/L (ref 20–29)
Calcium: 9.2 mg/dL (ref 8.7–10.2)
Creatinine, Ser: 0.69 mg/dL (ref 0.57–1.00)
GFR calc Af Amer: 121 mL/min/{1.73_m2} (ref >59)
GFR calc non Af Amer: 105 mL/min/{1.73_m2} (ref >59)
GLUCOSE: 90 mg/dL (ref 65–99)
Globulin, Total: 2.4 g/dL (ref 1.5–4.5)
POTASSIUM: 4.4 mmol/L (ref 3.5–5.2)
Sodium: 139 mmol/L (ref 134–144)
Total Protein: 6.9 g/dL (ref 6.0–8.5)

## 2016-10-11 LAB — CBC WITH DIFFERENTIAL/PLATELET
BASOS ABS: 0 10*3/uL (ref 0.0–0.2)
Basos: 1 %
EOS (ABSOLUTE): 0.1 10*3/uL (ref 0.0–0.4)
Eos: 1 %
Hematocrit: 38.3 % (ref 34.0–46.6)
Hemoglobin: 12.5 g/dL (ref 11.1–15.9)
IMMATURE GRANS (ABS): 0 10*3/uL (ref 0.0–0.1)
Immature Granulocytes: 0 %
LYMPHS: 27 %
Lymphocytes Absolute: 2.3 10*3/uL (ref 0.7–3.1)
MCH: 28.5 pg (ref 26.6–33.0)
MCHC: 32.6 g/dL (ref 31.5–35.7)
MCV: 87 fL (ref 79–97)
MONOS ABS: 0.6 10*3/uL (ref 0.1–0.9)
Monocytes: 7 %
NEUTROS ABS: 5.6 10*3/uL (ref 1.4–7.0)
Neutrophils: 64 %
PLATELETS: 477 10*3/uL — AB (ref 150–379)
RBC: 4.39 x10E6/uL (ref 3.77–5.28)
RDW: 14.4 % (ref 12.3–15.4)
WBC: 8.7 10*3/uL (ref 3.4–10.8)

## 2016-10-14 ENCOUNTER — Encounter: Payer: Self-pay | Admitting: Family Medicine

## 2016-10-14 MED ORDER — ESTRADIOL 0.1 MG/GM VA CREA: TOPICAL_CREAM | VAGINAL | 1 refills | Status: DC

## 2016-10-14 MED ORDER — LORAZEPAM 2 MG PO TABS: 2.0000 mg | ORAL_TABLET | Freq: Every day | ORAL | 1 refills | Status: DC | PRN

## 2016-10-14 MED FILL — ESTRADIOL 0.1 MG/GM CRM: 0.1 | 30 days supply | Qty: 43 | Fill #0

## 2016-10-14 MED FILL — LORazepam 2 MG TABS: 2 | 30 days supply | Qty: 30 | Fill #0

## 2016-10-14 NOTE — Progress Notes (Addendum)
Subjective:    Patient ID: Mia Thomas, female    DOB: 1970-11-12, 46 y.o.   MRN: [id number] Chief Complaint  Patient presents with  . Wants to discuss about hormones and premenopause    HPI  Noticing she is more hot and poor sleeping noticing.  Mom went through menopause at 32s.  Tried trazodone which did help but felt very groggy in the morning - got 6 hrs straight.  Lorazepam - used twice last week.  Is a safety blanket.  Mood ok on the lexapro but when she gets less sleep her anxiety gets revved up. Did have to be klonopin right after.  Zoloft has vivid bizarre dreams with drowning.   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  Notices new neck hair.  UTI - dysuria after intercourse. Started cranberry extract pills. More dryness right after periods  Taking tumeric w/ black pepper, cinnamon,  Juice plus for BP Omega-3.    Never used OCPs. Could feel IUD with menorrhagia so had to take it out after 3 weeks. Has started exercise and trying to loose weight for the past month, but not noticed any benefit for sleep.   Needs to get mammogram  Past Medical History:  Diagnosis Date  . Allergy   . Anxiety   . Asthma   . Depression   . Shingles    Past Surgical History:  Procedure Laterality Date  . CESAREAN SECTION    . CHOLECYSTECTOMY    . EYE SURGERY     repair corneal tare  . knee arthroscopy x 2     Current Outpatient Prescriptions on File Prior to Visit  Medication Sig Dispense Refill  . albuterol (PROVENTIL HFA;VENTOLIN HFA) 108 (90 Base) MCG/ACT inhaler Inhale 2 puffs into the lungs every 4 (four) hours as needed for wheezing or shortness of breath (cough, shortness of breath or wheezing.). 1 Inhaler 11  . escitalopram (LEXAPRO) 20 MG tablet Take 1 tablet (20 mg total) by mouth daily. 90 tablet 4  . montelukast (SINGULAIR) 10 MG tablet Take 1 tablet (10 mg total) by mouth at  bedtime. 90 tablet 4  . Spacer/Aero-Holding Chambers (AEROCHAMBER PLUS) inhaler Use as instructed 1 each 2   No current facility-administered medications on file prior to visit.    Allergies  Allergen Reactions  . Sulfa Antibiotics     Remo Lipps Johnson"s syndrome   Family History  Problem Relation Age of Onset  . Cancer Mother        skin  . Cancer Father        skin  . Heart disease Father    Social History   Social History  . Marital status: Married    Spouse name: Kenzli Barritt  . Number of children: 3  . Years of education: N/A   Occupational History  . 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 former Oncologist    Social History Main Topics  . Smoking status: Never Smoker  . Smokeless tobacco: Never Used  . Alcohol use No  . Drug use: No  . Sexual activity: Not Asked   Other Topics Concern  . None   Social History Narrative   Lives with her husband, [name] a local GI physician and the youngest of their 3 children.   Originally from [place], and moved to [place] from [place] in 2016.   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 She is considering what else she will do with her time as her children are much less dependent on her.   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   Depression screen [facility] 2/9 10/09/2016 04/29/2016 01/08/2016  Decreased Interest 0 0 0  Down, Depressed, Hopeless 0 0 0  PHQ - 2 Score 0 0 0    Review of Systems See hpi    Objective:   Physical Exam Constitutional: She is oriented to person, place, and time. She appears well-developed and well-nourished. No  distress.  HENT:  Head: Normocephalic and atraumatic.  Eyes: Conjunctivae are normal. Right eye exhibits no discharge. Left eye exhibits no discharge. No scleral icterus.  Neck: Neck supple. No thyromegaly or masses.  Cardiovascular: Normal rate, regular rhythm, normal heart sounds and intact distal pulses.   Pulmonary/Chest: Effort normal and breath sounds normal.  Lymphadenopathy:    She has no cervical or supraclavicular adenopathy.  Neurological: She is alert and oriented to person, place, and time.  Skin: Skin is warm and dry. She is not diaphoretic. No erythema.  Psychiatric: She has a normal mood and affect. Her behavior is normal.    BP 124/83   Pulse 62   Temp 98.3 F (36.8 C) (Oral)   Resp 18   Ht 5\' 6"  (1.676 m)   Wt 242 lb 6.4 oz (110 kg)   LMP 10/09/2016   SpO2 96%   BMI 39.12 kg/m      Assessment & Plan:   1. Insomnia, unspecified type - potential sx of menopause that is triggering PTSD sxs from last pregnancy.  Try 1/4 tab of trazodone 2 hrs before sleep on empty stomach. If still to sedating, could try belsomra vs rozerem vs elavil or doxepin.  Or gabapentin for insomnia and anxiety? Currently using lorazepam veyr sparingly but def doesn't want to be on a controlled med/bzd. Rozerem? ? Topamax?? For weight loss and anxiety? Or perhaps hormone supp/therapy?  2. Perimenopausal symptoms - check cbc, cmp, hormone panel inc thyroid. Pt planning to try evening primrose, can try other herbal supp as well like black cohosh - wonder if Avlimil wold work? Need to keep on top of mammograms/pelvics as all these have effect by trying to increase estrogen. If no effect, we could consider trial of changing lexapro - perhaps effexor or pristiq (since former helps hot flashes. . .) Failed zoloft prior.  3. Hirsutism   4. Screening for breast cancer - refer for mammogrma  5. Dysuria - developing UTI sxs after sex. Has been self-treating with some antibiotics she had left from prior -  augmentin, cipro. Cont trial of cranberry sugar and increased water. As has just been happening after intercourse for the past few months, try a very low dose of topical estrogen to urethra 2x/wk to plump external urethral cells to prevent bacterial enterence. If doesn't work, could try dose of Keflex post-coital. Needs pelvic at next OV  6.      Panic d/o - refill ativan - always taking 2 tabs at once so just double mg so can do 1 tab. Sched appt for CPE with pelvic  Orders Placed This Encounter  Procedures  . Urine Culture    Standing Status:   Future    Standing Expiration Date:   10/14/2017  . MM Digital Screening    Standing Status:   Future    Standing Expiration Date:   12/09/2017    Order Specific Question:   Reason for Exam (SYMPTOM  OR DIAGNOSIS REQUIRED)    Answer:   screening for breast cancer    Order Specific Question:   Is the patient pregnant?    Answer:   No    Order Specific Question:   Preferred imaging location?    Answer:   [facility]  . Hormone Panel  . POCT urinalysis dipstick    Standing Status:   Future    Standing Expiration Date:   10/14/2017  . POCT Microscopic Urinalysis ([facility])    Standing Status:   Future    Standing Expiration Date:   10/14/2017    Meds ordered this encounter  Medications  . LORazepam (ATIVAN) 2 MG tablet    Sig: Take 1 tablet (2 mg total) by mouth daily as needed (panic).    Dispense:  30 tablet    Refill:  1  . estradiol (ESTRACE) 0.1 MG/GM vaginal cream    Sig: Apply a pea-sized amount to external urethra twice a week to prevent bladder infections    Dispense:  42.5 g    Refill:  1   Over 40 min spent in face-to-face evaluation of and consultation with patient and coordination of care.  Over 50% of this time was spent counseling this patient.   Delman Cheadle, M.D.  Primary Care at [facility] [address] [place], [place] [address] [phone] phone [phone] fax  10/14/16 5:41 PM

## 2016-10-16 DIAGNOSIS — F410 Panic disorder [episodic paroxysmal anxiety] without agoraphobia: Secondary | ICD-10-CM | POA: Insufficient documentation

## 2016-10-16 DIAGNOSIS — N951 Menopausal and female climacteric states: Secondary | ICD-10-CM | POA: Insufficient documentation

## 2016-10-17 LAB — HORMONE PANEL (T4,TSH,FSH,TESTT,SHBG,DHEA,ETC)
DHEA-Sulfate, LCMS: 163 ug/dL
Estradiol, Serum, MS: 51 pg/mL
Estrone Sulfate: 70 ng/dL
FREE T-3: 3.2 pg/mL
FSH: 11 m[IU]/mL
Free Testosterone, Serum: 1.4 pg/mL
Progesterone, Serum: 325 ng/dL
Sex Hormone Binding Globulin: 44.7 nmol/L
T4: 7 ug/dL
TESTOSTERONE, SERUM (TOTAL): 12 ng/dL
TESTOSTERONE-% FREE: 1.2 %
TRIIODOTHYRONINE (T-3), SERUM: 119 ng/dL
TSH-ICMA: 1.2 uU/mL

## 2016-10-24 ENCOUNTER — Encounter: Payer: Self-pay | Admitting: Family Medicine

## 2016-10-28 MED ORDER — TRAZODONE HCL 50 MG PO TABS: 50.0000 mg | ORAL_TABLET | Freq: Every evening | ORAL | 3 refills | Status: DC | PRN

## 2016-10-28 MED FILL — traZODone HCL 50 MG TABS: 50 | 45 days supply | Qty: 90 | Fill #0

## 2016-11-03 MED FILL — ESCITALOPRAM 20 MG TABLET: 20 | 90 days supply | Qty: 90 | Fill #2

## 2016-11-18 MED FILL — MONTELUKAST SOD 10 MG TAB: 10 | 90 days supply | Qty: 90 | Fill #2

## 2016-11-20 ENCOUNTER — Encounter: Payer: Self-pay | Admitting: Family Medicine

## 2016-12-05 DIAGNOSIS — Z23 Encounter for immunization: Secondary | ICD-10-CM | POA: Diagnosis not present

## 2016-12-09 ENCOUNTER — Ambulatory Visit
Admission: RE | Admit: 2016-12-09 | Discharge: 2016-12-09 | Disposition: A | Discharge status: Home / Self Care | Payer: 59 | Source: Ambulatory Visit | Attending: Family Medicine | Admitting: Family Medicine

## 2016-12-09 DIAGNOSIS — Z1231 Encounter for screening mammogram for malignant neoplasm of breast: Secondary | ICD-10-CM | POA: Diagnosis not present

## 2016-12-09 DIAGNOSIS — Z1239 Encounter for other screening for malignant neoplasm of breast: Secondary | ICD-10-CM

## 2017-01-01 ENCOUNTER — Other Ambulatory Visit: Payer: Self-pay | Admitting: Gynecology

## 2017-01-01 DIAGNOSIS — N939 Abnormal uterine and vaginal bleeding, unspecified: Secondary | ICD-10-CM

## 2017-01-12 MED FILL — LORazepam 2 MG TABS: 2 | 30 days supply | Qty: 30 | Fill #1

## 2017-01-14 ENCOUNTER — Ambulatory Visit: Payer: 59 | Admitting: Gynecology

## 2017-01-14 ENCOUNTER — Ambulatory Visit (INDEPENDENT_AMBULATORY_CARE_PROVIDER_SITE_OTHER): Payer: 59

## 2017-01-14 ENCOUNTER — Other Ambulatory Visit: Payer: Self-pay | Admitting: Gynecology

## 2017-01-14 ENCOUNTER — Encounter: Payer: Self-pay | Admitting: Gynecology

## 2017-01-14 VITALS — BP 118/76 | Ht 66.0 in | Wt 238.0 lb

## 2017-01-14 DIAGNOSIS — N839 Noninflammatory disorder of ovary, fallopian tube and broad ligament, unspecified: Secondary | ICD-10-CM

## 2017-01-14 DIAGNOSIS — Z01419 Encounter for gynecological examination (general) (routine) without abnormal findings: Secondary | ICD-10-CM

## 2017-01-14 DIAGNOSIS — N838 Other noninflammatory disorders of ovary, fallopian tube and broad ligament: Secondary | ICD-10-CM

## 2017-01-14 DIAGNOSIS — Z1151 Encounter for screening for human papillomavirus (HPV): Secondary | ICD-10-CM | POA: Diagnosis not present

## 2017-01-14 DIAGNOSIS — N939 Abnormal uterine and vaginal bleeding, unspecified: Secondary | ICD-10-CM | POA: Diagnosis not present

## 2017-01-14 DIAGNOSIS — D252 Subserosal leiomyoma of uterus: Secondary | ICD-10-CM

## 2017-01-14 DIAGNOSIS — R140 Abdominal distension (gaseous): Secondary | ICD-10-CM

## 2017-01-14 NOTE — Patient Instructions (Signed)
Office will call you to arrange for the MRI.  Call my office if you do not hear from them within a week.

## 2017-01-14 NOTE — Progress Notes (Signed)
Mia Thomas 1970/04/14 [id number]        46 y.o.  G2P2 new patient who presents for gynecologic exam.  It is been over 5 years since her last gynecologic exam.  No history of abnormal Pap smears.  Status post C-section x1 and vaginal delivery x1.  Vasectomy birth control.  Over the last 3 plus months the patient has been having abdominal bloating and discomfort that comes and goes.  Mid abdomen to lower abdomen.  No sharp stabbing or acute pains.  May go several days without but then returns.  Is having issues with alternating diarrhea and constipation.  Has tried dietary changes without relief of her symptoms.  No nausea or vomiting.  No urinary symptoms such as frequency dysuria urgency low back pain fever or chills.  Having regular monthly menses without skips or bleeding in between.  No menopausal symptoms such as hot flushes or sweats.  Did have a number of hormonal studies done elsewhere to include normal FSH and estrogen/progesterone levels.  Reports a normal breast exam  by her primary physician and negative mammography 12/2016.  Declines breast exam today.  Past medical history,surgical history, problem list, medications, allergies, family history and social history were all reviewed and documented in the EPIC chart.  Directed ROS with pertinent positives and negatives documented in the history of present illness/assessment and plan.  Exam: [name] assistant Vitals:   01/14/17 1023  BP: 118/76  Weight: 238 lb (108 kg)  Height: 5\' 6"  (1.676 m)   General appearance:  Normal Abdomen soft nontender without masses guarding rebound Pelvic external BUS vagina normal. Cervix normal.  Pap smear/HPV. Uterus grossly normal size midline mobile nontender. Adnexa without masses or tenderness. Rectal exam is normal.  Assessment/Plan:  46 y.o. G2P2 with regular monthly menses, vasectomy birth control.  Pap smear/HPV done today.  Recent breast exam and mammography negative.  History of  several months of abdominal bloating both mid and lower abdomen.  No localizing symptoms.  Exam is normal.  Does have some diarrhea and constipation alternating which points towards a GI etiology.  No urinary symptoms.  Patient will have GYN ultrasound today as previously arranged to rule out GYN etiology.  Assuming negative then patient will pursue a more aggressive GI evaluation and workup.  Otherwise from a gynecologic standpoint she will follow-up in 1 year for annual exam.  Addendum: Patient presents after ultrasound for discussion with her husband.  Ultrasound transvaginal and transabdominal shows uterus normal size and echotexture.  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  Increased vascularity noted on Doppler flow.  Right ovary normal.  Left ovary with a thin walled cystic/solid mass with positive arterial flow 25 x 20 x 24 mm.  Cul-de-sac negative.  Reviewed with the patient and her husband the myoma.  She does have a history of being told she had a myoma previously on ultrasound but describes it as being 1 cm.  This appears to have enlarged slightly since then although it has been several years.  We did review the increased vascularity on Doppler flow.  Differential to include vascular myoma, variants such as adenofibroma, the spectrum of cellular leiomyoma with or without atypia and sarcoma.  Given the impressive vascularity I recommended we follow-up with MRI to better define the myoma.  Will allow for assessment of the remainder of the pelvis also.  Various scenarios were reviewed to include follow-up studies for stability in the future versus second opinion with gynecologic  oncologist also discussed.  We will arrange for the MRI and then she will follow-up afterwards for discussion and triage based upon these results.    Anastasio Auerbach MD, 11:09 AM 01/14/2017

## 2017-01-15 LAB — URINALYSIS W MICROSCOPIC + REFLEX CULTURE
BACTERIA UA: NONE SEEN /HPF
Bilirubin Urine: NEGATIVE
Glucose, UA: NEGATIVE
HYALINE CAST: NONE SEEN /LPF
Hgb urine dipstick: NEGATIVE
KETONES UR: NEGATIVE
Leukocyte Esterase: NEGATIVE
Nitrites, Initial: NEGATIVE
Protein, ur: NEGATIVE
RBC / HPF: NONE SEEN /HPF (ref 0–2)
Specific Gravity, Urine: 1.005 (ref 1.001–1.03)
WBC, UA: NONE SEEN /HPF (ref 0–5)
pH: 6.5 (ref 5.0–8.0)

## 2017-01-15 LAB — NO CULTURE INDICATED

## 2017-01-16 LAB — PAP IG AND HPV HIGH-RISK: HPV DNA HIGH RISK: NOT DETECTED

## 2017-01-18 MED FILL — traZODone HCL 50 MG TABS: 50 | 45 days supply | Qty: 90 | Fill #1

## 2017-01-21 ENCOUNTER — Telehealth: Payer: Self-pay | Admitting: *Deleted

## 2017-01-21 DIAGNOSIS — D259 Leiomyoma of uterus, unspecified: Secondary | ICD-10-CM

## 2017-01-21 DIAGNOSIS — D219 Benign neoplasm of connective and other soft tissue, unspecified: Secondary | ICD-10-CM

## 2017-01-21 NOTE — Telephone Encounter (Signed)
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

## 2017-01-21 NOTE — Telephone Encounter (Signed)
Order placed spoke with [facility] imaging and they are going to call pt to schedule.

## 2017-01-26 ENCOUNTER — Encounter: Payer: Self-pay | Admitting: Gynecology

## 2017-01-31 ENCOUNTER — Telehealth: Payer: Self-pay

## 2017-01-31 NOTE — Telephone Encounter (Signed)
I called [facility] and spoke with [place] to inquire if pre auth required for [address] MRI of pelvis. Was advised prior auth not required. Call ref [phone].

## 2017-01-31 NOTE — Telephone Encounter (Signed)
Patient scheduled on 02/01/17 @ 10:00am

## 2017-02-01 ENCOUNTER — Ambulatory Visit
Admission: RE | Admit: 2017-02-01 | Discharge: 2017-02-01 | Disposition: A | Discharge status: Home / Self Care | Payer: 59 | Source: Ambulatory Visit | Attending: Gynecology | Admitting: Gynecology

## 2017-02-01 ENCOUNTER — Other Ambulatory Visit: Payer: Self-pay | Admitting: Gynecology

## 2017-02-01 DIAGNOSIS — D219 Benign neoplasm of connective and other soft tissue, unspecified: Secondary | ICD-10-CM

## 2017-02-01 DIAGNOSIS — D252 Subserosal leiomyoma of uterus: Secondary | ICD-10-CM | POA: Diagnosis not present

## 2017-02-01 MED ORDER — GADOBENATE DIMEGLUMINE 529 MG/ML IV SOLN
20.0000 mL | Freq: Once | INTRAVENOUS | Status: AC | PRN
  Administered 2017-02-01: 20 mL via INTRAVENOUS

## 2017-02-06 ENCOUNTER — Ambulatory Visit
Admission: RE | Admit: 2017-02-06 | Discharge: 2017-02-06 | Disposition: A | Discharge status: Home / Self Care | Payer: 59 | Source: Ambulatory Visit | Attending: Gynecology | Admitting: Gynecology

## 2017-02-06 DIAGNOSIS — D219 Benign neoplasm of connective and other soft tissue, unspecified: Secondary | ICD-10-CM

## 2017-03-04 MED FILL — ESCITALOPRAM 20 MG TABLET: 20 | 90 days supply | Qty: 90 | Fill #3

## 2017-03-04 MED FILL — MONTELUKAST SOD 10 MG TAB: 10 | 90 days supply | Qty: 90 | Fill #3

## 2017-04-10 MED FILL — traZODone HCL 50 MG TABS: 50 | 45 days supply | Qty: 90 | Fill #2

## 2017-05-02 ENCOUNTER — Telehealth: Payer: Self-pay | Admitting: Family Medicine

## 2017-05-02 MED ORDER — CLONAZEPAM 2 MG PO TABS: 2.0000 mg | ORAL_TABLET | Freq: Every day | ORAL | 0 refills | Status: DC

## 2017-05-02 MED FILL — clonazePAM 2 MG TABS: 2 | 30 days supply | Qty: 30 | Fill #0

## 2017-06-11 NOTE — Telephone Encounter (Signed)
Pt's husband called. Her anxiety was getting sig worse - snowballing with her lack of sleep. Did very well just taking a few klonopin to get sleep cycle restarted many years prior during a highly stressful time so would like to try the same again.  Feels like if she could just get some regular sleep for a few nights, then everything else will fall in line.  She will call or come in if things don't improve soon.

## 2017-06-25 ENCOUNTER — Encounter: Payer: Self-pay | Admitting: Family Medicine

## 2017-06-27 MED ORDER — ESCITALOPRAM OXALATE 20 MG PO TABS: 20.0000 mg | ORAL_TABLET | Freq: Every day | ORAL | 3 refills | Status: DC

## 2017-06-27 MED FILL — ESCITALOPRAM 20 MG TABLET: 20 | 90 days supply | Qty: 90 | Fill #0

## 2017-07-02 ENCOUNTER — Other Ambulatory Visit: Payer: Self-pay | Admitting: Gynecology

## 2017-07-02 DIAGNOSIS — D251 Intramural leiomyoma of uterus: Secondary | ICD-10-CM

## 2017-07-16 ENCOUNTER — Ambulatory Visit (INDEPENDENT_AMBULATORY_CARE_PROVIDER_SITE_OTHER): Payer: 59

## 2017-07-16 ENCOUNTER — Ambulatory Visit (INDEPENDENT_AMBULATORY_CARE_PROVIDER_SITE_OTHER): Payer: 59 | Admitting: Gynecology

## 2017-07-16 ENCOUNTER — Encounter: Payer: Self-pay | Admitting: Gynecology

## 2017-07-16 VITALS — BP 122/76

## 2017-07-16 DIAGNOSIS — D259 Leiomyoma of uterus, unspecified: Secondary | ICD-10-CM | POA: Diagnosis not present

## 2017-07-16 DIAGNOSIS — D251 Intramural leiomyoma of uterus: Secondary | ICD-10-CM | POA: Diagnosis not present

## 2017-07-16 NOTE — Progress Notes (Signed)
    Mia Thomas 1970-07-10 [id number]        47 y.o.  G2P2 presents for follow-up ultrasound.  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  She had a follow-up MRI which confirmed the myoma with no suspicious characteristics.  I asked her to return at a 47-month interval to recheck the myoma given the vascularity.  Past medical history,surgical history, problem list, medications, allergies, family history and social history were all reviewed and documented in the EPIC chart.  Directed ROS with pertinent positives and negatives documented in the history of present illness/assessment and plan.  Exam: Vitals:   07/16/17 1425  BP: 122/76   General appearance:  Normal  Ultrasound transvaginal shows uterus normal size and echotexture.  Again fundal myoma noted with increased vascularity measuring 39 x 27 x 25 mm.  Right ovary normal.  Left ovary with small follicular cyst 14 x 11 mm.  Cul-de-sac negative  Assessment/Plan:  47 y.o. 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  Will plan follow-up exam in November when she is due for her annual exam and repeat ultrasound at that time.  If it continues to remain stable then we will plan no further surveillance/intervention.  The patient is comfortable with the plan.    Anastasio Auerbach MD, 2:49 PM 07/16/2017

## 2017-07-16 NOTE — Patient Instructions (Signed)
Follow-up for annual exam this coming fall and we will plan on repeating your ultrasound at that time.  Go [facility]!

## 2017-07-29 MED FILL — traZODone HCL 50 MG TABS: 50 | 45 days supply | Qty: 90 | Fill #3

## 2017-10-23 DIAGNOSIS — D2371 Other benign neoplasm of skin of right lower limb, including hip: Secondary | ICD-10-CM | POA: Diagnosis not present

## 2017-10-23 DIAGNOSIS — D2271 Melanocytic nevi of right lower limb, including hip: Secondary | ICD-10-CM | POA: Diagnosis not present

## 2017-10-23 DIAGNOSIS — D225 Melanocytic nevi of trunk: Secondary | ICD-10-CM | POA: Diagnosis not present

## 2017-10-23 DIAGNOSIS — D2361 Other benign neoplasm of skin of right upper limb, including shoulder: Secondary | ICD-10-CM | POA: Diagnosis not present

## 2017-10-23 DIAGNOSIS — L812 Freckles: Secondary | ICD-10-CM | POA: Diagnosis not present

## 2017-10-23 DIAGNOSIS — D485 Neoplasm of uncertain behavior of skin: Secondary | ICD-10-CM | POA: Diagnosis not present

## 2017-10-23 DIAGNOSIS — L814 Other melanin hyperpigmentation: Secondary | ICD-10-CM | POA: Diagnosis not present

## 2017-10-27 MED FILL — ESCITALOPRAM 20 MG TABLET: 20 | 90 days supply | Qty: 90 | Fill #1

## 2017-10-28 ENCOUNTER — Other Ambulatory Visit: Payer: Self-pay | Admitting: Family Medicine

## 2017-10-28 NOTE — Telephone Encounter (Signed)
trazadone refill Last Refill:07/29/17 # 90 Last OV: 10/09/16 PCP: Dr Delman Cheadle  Pharmacy: [facility]  montelukast refill Last Refill:03/04/17 # 90 Last OV: 04/29/16 PCP: Dr Delman Cheadle Pharmacy: [facility]

## 2017-11-06 ENCOUNTER — Other Ambulatory Visit: Payer: Self-pay | Admitting: Family Medicine

## 2017-11-06 MED ORDER — MONTELUKAST SODIUM 10 MG PO TABS: 10.0000 mg | ORAL_TABLET | Freq: Every day | ORAL | 0 refills | Status: DC

## 2017-11-06 MED ORDER — TRAZODONE HCL 50 MG PO TABS: 50.0000 mg | ORAL_TABLET | Freq: Every evening | ORAL | 0 refills | Status: DC | PRN

## 2017-11-06 NOTE — Progress Notes (Signed)
Patient requested refills PCP approved PCP [facility]

## 2017-11-12 ENCOUNTER — Other Ambulatory Visit: Payer: Self-pay | Admitting: Family Medicine

## 2017-11-13 MED FILL — MONTELUKAST SOD 10 MG TAB: 10 | 90 days supply | Qty: 90 | Fill #0

## 2017-11-13 MED FILL — traZODone HCL 50 MG TABS: 50 | 45 days supply | Qty: 90 | Fill #0

## 2017-11-17 ENCOUNTER — Other Ambulatory Visit: Payer: Self-pay | Admitting: *Deleted

## 2017-11-17 DIAGNOSIS — D252 Subserosal leiomyoma of uterus: Secondary | ICD-10-CM

## 2017-11-27 ENCOUNTER — Ambulatory Visit (INDEPENDENT_AMBULATORY_CARE_PROVIDER_SITE_OTHER): Payer: 59 | Admitting: Family Medicine

## 2017-11-27 ENCOUNTER — Encounter: Payer: Self-pay | Admitting: Family Medicine

## 2017-11-27 ENCOUNTER — Other Ambulatory Visit: Payer: Self-pay

## 2017-11-27 VITALS — BP 124/82 | HR 65 | Temp 97.9°F | Ht 67.0 in | Wt 233.0 lb

## 2017-11-27 DIAGNOSIS — J3089 Other allergic rhinitis: Secondary | ICD-10-CM | POA: Diagnosis not present

## 2017-11-27 DIAGNOSIS — Z23 Encounter for immunization: Secondary | ICD-10-CM | POA: Diagnosis not present

## 2017-11-27 DIAGNOSIS — G4700 Insomnia, unspecified: Secondary | ICD-10-CM

## 2017-11-27 DIAGNOSIS — F418 Other specified anxiety disorders: Secondary | ICD-10-CM

## 2017-11-27 DIAGNOSIS — J4520 Mild intermittent asthma, uncomplicated: Secondary | ICD-10-CM

## 2017-11-27 MED ORDER — MONTELUKAST SODIUM 10 MG PO TABS: 10.0000 mg | ORAL_TABLET | Freq: Every day | ORAL | 2 refills | Status: DC

## 2017-11-27 MED ORDER — TRAZODONE HCL 50 MG PO TABS: 50.0000 mg | ORAL_TABLET | Freq: Every evening | ORAL | 2 refills | Status: DC | PRN

## 2017-11-27 NOTE — Progress Notes (Signed)
Subjective:  By signing my name below, I, Mia Thomas, attest that this documentation has been prepared under the direction and in the presence of Mia Ray, MD. Electronically Signed: Moises Thomas, [place]. 11/27/2017 , 3:32 PM .  Patient was seen in Room 12 .   Patient ID: Mia Thomas, female    DOB: 05/27/1970, 47 y.o.   MRN: [id number] Chief Complaint  Patient presents with  . Medication Refill    follow up so she get refills later (just got meds. 0 refills remain)   HPI Mia Thomas is a 47 y.o. female Here to discuss medication refills. PCP is Shawnee Knapp, MD.   She's originally from [place]. Her children are 85 years old, 54 years old and 93 years old. Her husband, Mia Thomas, works at [employer].   Depression with Anxiety  She has a history of anxiety and depression with panic anxiety syndrome per problem list. Phone note from March 1st, worsening anxiety. Plan for klonopin prn #30 on March 1st. She has taken Trazodone qhs prn 50-100 mg. She has been on Lexapro 20 mg qd for daily medication.   She states she's been taking 1 trazodone every night, and it helps some. She notes 2 tablets of trazodone works better at night, but feels drowsiness and groggy in the morning with decreased focus. She has Ativan only as "break the glass" emergency when she has bad panic attacks; maybe has takes it 3-5 times a year. She has PTSD with eye injury while she was pregnant with her last child.   Depression screen [facility] 2/9 11/27/2017 10/09/2016 04/29/2016 01/08/2016  Decreased Interest 0 0 0 0  Down, Depressed, Hopeless 0 0 0 0  PHQ - 2 Score 0 0 0 0    Asthma She takes Singulair 10 mg qd, and albuterol inhaler as needed. She has a history of allergic rhinitis.   She states she has been doing really well on Singulair 10 mg qd. She notes she's only used albuterol when she has a bronchial cold, which she hasn't had recently. She denies any wheezing.   Health  maintenance She received her flu shot today.   Patient Active Problem List   Diagnosis Date Noted  . Panic anxiety syndrome 10/16/2016  . Perimenopausal symptoms 10/16/2016  . Depression 04/28/2016  . Anxiety 04/28/2016  . 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 Allergic rhinitis 01/08/2016   Past Medical History:  Diagnosis Date  . Allergy   . Anxiety   . Asthma   . Depression   . Shingles    Past Surgical History:  Procedure Laterality Date  . CESAREAN SECTION    . CHOLECYSTECTOMY    . EYE SURGERY     repair corneal tare  . knee arthroscopy x 2     Allergies  Allergen Reactions  . Sulfa Antibiotics     Remo Lipps Johnson"s syndrome   Prior to Admission medications   Medication Sig Start Date End Date Taking? Authorizing Provider  albuterol (PROVENTIL HFA;VENTOLIN HFA) 108 (90 Base) MCG/ACT inhaler Inhale 2 puffs into the lungs every 4 (four) hours as needed for wheezing or shortness of breath (cough, shortness of breath or wheezing.). 04/06/15  Yes Shawnee Knapp, MD  escitalopram (LEXAPRO) 20 MG tablet Take 1 tablet (20 mg total) by mouth daily. 06/27/17  Yes Shawnee Knapp, MD  estradiol (ESTRACE) 0.1 MG/GM vaginal cream Apply a pea-sized amount to external urethra twice a week to prevent bladder infections 10/14/16  Yes Shawnee Knapp, MD  montelukast (SINGULAIR) 10 MG tablet Take 1 tablet (10 mg total) by mouth at bedtime. 11/06/17  Yes Rutherford Guys, MD  Spacer/Aero-Holding Chambers (AEROCHAMBER PLUS) inhaler Use as instructed 04/06/15  Yes Shawnee Knapp, MD  traZODone (DESYREL) 50 MG tablet Take 1-2 tablets (50-100 mg total) by mouth at bedtime as needed for sleep. 11/06/17  Yes Rutherford Guys, MD  clonazePAM (KLONOPIN) 2 MG tablet Take 1 tablet (2 mg total) by mouth at bedtime. Patient not taking: Reported on 07/16/2017 05/02/17   Shawnee Knapp, MD  LORazepam (ATIVAN) 2 MG tablet Take 1 tablet (2 mg total) by mouth daily as needed (panic). Patient not taking: Reported on 07/16/2017 10/14/16   Shawnee Knapp, MD   Social History   Socioeconomic History  . Marital status: Married    Spouse name: Mia Thomas  . Number of children: 3  . Years of education: Not on file  . Highest education level: Not on file  Occupational History  . 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 Occupation: former Oncologist  Social Needs  . Financial resource strain: Not on file  . Food insecurity:    Worry: Not on file    Inability: Not on file  . Transportation needs:    Medical: Not on file    Non-medical: Not on file  Tobacco Use  . Smoking status: Never Smoker  . Smokeless tobacco: Never Used  Substance and Sexual Activity  . Alcohol use: Yes    Alcohol/week: 0.0 standard drinks    Comment: Rare  . Drug use: No  . Sexual activity: Yes    Birth control/protection: Other-see comments    Comment: Vasectomy-1st intercourse 66 yo-1 partner  Lifestyle  . Physical activity:    Days per week: Not on file    Minutes per session: Not on file  . Stress: Not on file  Relationships  . Social connections:    Talks on phone: Not on file    Gets together: Not on file    Attends religious service: Not on file    Active member of club or organization: Not on file    Attends meetings of clubs or organizations: Not on file    Relationship status: Not on file  . Intimate partner violence:    Fear of current or ex partner: Not on file    Emotionally abused: Not on file    Physically abused: Not on file    Forced sexual activity: Not on file  Other Topics Concern  . Not on file  Social History Narrative   Lives with her husband, Mia Thomas a local GI physician and the youngest of their 3 children.   Originally from [place], and moved to [place] from [place] in 2016.   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 She is considering what else she will do with  her time as her children are much less dependent on her.   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   Review of Systems  Constitutional: Negative for chills, fatigue, fever and unexpected weight change.  Respiratory: Negative for  cough.   Gastrointestinal: Negative for constipation, diarrhea, nausea and vomiting.  Skin: Negative for rash and wound.  Neurological: Negative for dizziness, weakness and headaches.       Objective:   Physical Exam  Constitutional: She is oriented to person, place, and time. She appears well-developed and well-nourished. No distress.  HENT:  Head: Normocephalic and atraumatic.  Eyes: Pupils are equal, round, and reactive to light. EOM are normal.  Neck: Neck supple.  Cardiovascular: Normal rate.  Pulmonary/Chest: Effort normal. No respiratory distress. She has no wheezes.  Musculoskeletal: Normal range of motion.  Neurological: She is alert and oriented to person, place, and time.  Skin: Skin is warm and dry.  Psychiatric: She has a normal mood and affect. Her behavior is normal.  Nursing note and vitals reviewed.   Vitals:   11/27/17 1442  BP: 124/82  Pulse: 65  Temp: 97.9 F (36.6 C)  TempSrc: Oral  SpO2: 93%  Weight: 233 lb (105.7 kg)  Height: 5\' 7"  (1.702 m)       Assessment & Plan:   Callaway Hardigree is a 47 y.o. female Depression with anxiety - Plan: traZODone (DESYREL) 50 MG tablet  -Overall stable with current regimen including Lexapro.  Trazodone at bedtime is overall been effective, but does have other options if flare of anxiety symptoms or worsening insomnia.  Continue same doses, follow-up with primary care provider for physical in 6 months  Need for influenza vaccination - Plan:  Flu Vaccine QUAD 36+ mos IM  Mild intermittent asthma without complication - Plan: montelukast (SINGULAIR) 10 MG tablet  -Stable with Singulair, other options discussed regarding inhalers if flare of symptoms.  Allergic rhinitis due to other allergic trigger, unspecified seasonality - Plan: montelukast (SINGULAIR) 10 MG tablet  -Trigger avoidance discussed as well as over-the-counter options prior to start of her specific allergy season.  Stable at this time, no changes  Insomnia, unspecified type - Plan: traZODone (DESYREL) 50 MG tablet  -As above, continue same dose trazodone.  Meds ordered this encounter  Medications  . montelukast (SINGULAIR) 10 MG tablet    Sig: Take 1 tablet (10 mg total) by mouth at bedtime.    Dispense:  90 tablet    Refill:  2  . traZODone (DESYREL) 50 MG tablet    Sig: Take 1-2 tablets (50-100 mg total) by mouth at bedtime as needed for sleep.    Dispense:  90 tablet    Refill:  2   Patient Instructions   Thank you for coming in today.  No med changes at this time.  If you notice some changes in trazodone effectiveness for sleep, there are some other options.  Follow-up with Dr. Brigitte Pulse in the next 6 months for a physical.  Let me know if there are questions prior to that visit.   If you have lab work done today you will be contacted with your lab results within the next 2 weeks.  If you have not heard from [place] then please contact us. The fastest way to get your results is to register for My Chart.   IF you received an x-Thomas today, you will receive an invoice from [facility]. Please contact [facility] at [phone] with questions or concerns regarding your invoice.   IF you received labwork today, you will receive an invoice from [place]. Please contact [facility] at [phone] with questions or concerns regarding your invoice.   Our billing staff will not be able to assist you with questions regarding  bills from these companies.  You  will be contacted with the lab results as soon as they are available. The fastest way to get your results is to activate your My Chart account. Instructions are located on the last page of this paperwork. If you have not heard from [place] regarding the results in 2 weeks, please contact this office.       I personally performed the services described in this documentation, which was scribed in my presence. The recorded information has been reviewed and considered for accuracy and completeness, addended by me as needed, and agree with information above.  Signed,   Mia Ray, MD [facility] [place].  11/29/17 8:12 AM

## 2017-11-27 NOTE — Patient Instructions (Addendum)
Thank you for coming in today.  No med changes at this time.  If you notice some changes in trazodone effectiveness for sleep, there are some other options.  Follow-up with Dr. Brigitte Pulse in the next 6 months for a physical.  Let me know if there are questions prior to that visit.   If you have lab work done today you will be contacted with your lab results within the next 2 weeks.  If you have not heard from [place] then please contact us. The fastest way to get your results is to register for My Chart.   IF you received an x-ray today, you will receive an invoice from [facility]. Please contact [facility] at [phone] with questions or concerns regarding your invoice.   IF you received labwork today, you will receive an invoice from [place]. Please contact [facility] at [phone] with questions or concerns regarding your invoice.   Our billing staff will not be able to assist you with questions regarding bills from these companies.  You will be contacted with the lab results as soon as they are available. The fastest way to get your results is to activate your My Chart account. Instructions are located on the last page of this paperwork. If you have not heard from [place] regarding the results in 2 weeks, please contact this office.

## 2017-12-10 ENCOUNTER — Other Ambulatory Visit: Payer: 59

## 2017-12-10 ENCOUNTER — Ambulatory Visit: Payer: 59 | Admitting: Gynecology

## 2018-01-15 ENCOUNTER — Ambulatory Visit (INDEPENDENT_AMBULATORY_CARE_PROVIDER_SITE_OTHER): Payer: 59

## 2018-01-15 ENCOUNTER — Encounter: Payer: Self-pay | Admitting: Gynecology

## 2018-01-15 ENCOUNTER — Ambulatory Visit (INDEPENDENT_AMBULATORY_CARE_PROVIDER_SITE_OTHER): Payer: 59 | Admitting: Gynecology

## 2018-01-15 VITALS — BP 122/78 | Ht 66.0 in | Wt 227.0 lb

## 2018-01-15 DIAGNOSIS — D259 Leiomyoma of uterus, unspecified: Secondary | ICD-10-CM

## 2018-01-15 DIAGNOSIS — Z01419 Encounter for gynecological examination (general) (routine) without abnormal findings: Secondary | ICD-10-CM

## 2018-01-15 DIAGNOSIS — D252 Subserosal leiomyoma of uterus: Secondary | ICD-10-CM | POA: Diagnosis not present

## 2018-01-15 NOTE — Patient Instructions (Signed)
Follow-up for annual exam in 1 year, sooner if any issues

## 2018-01-15 NOTE — Progress Notes (Signed)
    Mia Thomas December 31, 1970 [id number]        47 y.o.  G2P2 for annual gynecologic exam.  Also for follow-up ultrasound due to having a vascular myoma that we have been following.  Without gynecologic complaints.  Regular monthly menses.  Past medical history,surgical history, problem list, medications, allergies, family history and social history were all reviewed and documented as reviewed in the EPIC chart.  ROS:  Performed with pertinent positives and negatives included in the history, assessment and plan.   Additional significant findings : None   Exam: [name] assistant Vitals:   01/15/18 0850  BP: 122/78  Weight: 227 lb (103 kg)  Height: 5\' 6"  (1.676 m)   Body mass index is 36.64 kg/m.  General appearance:  Normal affect, orientation and appearance. Skin: Grossly normal HEENT: Without gross lesions.  No cervical or supraclavicular adenopathy. Thyroid normal.  Lungs:  Clear without wheezing, rales or rhonchi Cardiac: RR, without RMG Abdominal:  Soft, nontender, without masses, guarding, rebound, organomegaly or hernia Breasts:  Examined lying and sitting without masses, retractions, discharge or axillary adenopathy. Pelvic:  Ext, BUS, Vagina: Normal  Cervix: Normal  Uterus: Anteverted, normal size, shape and contour, midline and mobile nontender   Adnexa: Without masses or tenderness    Anus and perineum: Normal   Rectovaginal: Normal sphincter tone without palpated masses or tenderness.   Ultrasound transvaginal shows uterus normal size and echotexture.  Fundal myoma measuring 35 x 32 x 35 mm with decreased vascularity from prior study noted.  Decreased echogenicity from prior study noted.  Endometrial echo 10.1 mm.  Right ovary with corpus luteal cyst 20 x 16 mm.  Left ovary normal.  Cul-de-sac negative.  Assessment/Plan:  47 y.o. G2P2 female for annual gynecologic exam with regular menses, vasectomy birth control.   1. Vascular fundal myoma.  On follow-up today  decreased vascularity noted.  Remains unchanged in size.  Given the overall stability and decreased vascularity all consistent with benign myoma.  Discussed with patient were both comfortable with stop following at this point. 2. Mammography due and patient will schedule.  Breast exam normal today. 3. Pap smear/HPV 01/2017.  No Pap smear done today.  No history of significant abnormal Pap smears.  Plan repeat Pap smear/HPV at 5-year interval. 4. Health maintenance.  No routine lab work done as patient does this elsewhere.  Follow-up 1 year, sooner as needed.   Anastasio Auerbach MD, 9:17 AM 01/15/2018

## 2018-01-20 ENCOUNTER — Ambulatory Visit
Admission: RE | Admit: 2018-01-20 | Discharge: 2018-01-20 | Disposition: A | Discharge status: Home / Self Care | Payer: 59 | Source: Ambulatory Visit | Attending: Family Medicine | Admitting: Family Medicine

## 2018-01-20 ENCOUNTER — Other Ambulatory Visit: Payer: Self-pay | Admitting: Family Medicine

## 2018-01-20 DIAGNOSIS — Z1231 Encounter for screening mammogram for malignant neoplasm of breast: Secondary | ICD-10-CM

## 2018-01-27 MED FILL — ESCITALOPRAM 20 MG TABLET: 20 | 90 days supply | Qty: 90 | Fill #2

## 2018-02-08 ENCOUNTER — Encounter: Payer: Self-pay | Admitting: Family Medicine

## 2018-02-12 MED FILL — traZODone HCL 50 MG TABS: 50 | 45 days supply | Qty: 90 | Fill #0

## 2018-02-26 ENCOUNTER — Other Ambulatory Visit: Payer: Self-pay

## 2018-02-26 ENCOUNTER — Telehealth: Payer: Self-pay | Admitting: *Deleted

## 2018-02-26 ENCOUNTER — Ambulatory Visit (INDEPENDENT_AMBULATORY_CARE_PROVIDER_SITE_OTHER): Payer: 59 | Admitting: Family Medicine

## 2018-02-26 VITALS — BP 114/74 | HR 81 | Ht 66.73 in | Wt 231.0 lb

## 2018-02-26 DIAGNOSIS — Z130 Encounter for screening for diseases of the blood and blood-forming organs and certain disorders involving the immune mechanism: Secondary | ICD-10-CM | POA: Diagnosis not present

## 2018-02-26 DIAGNOSIS — M1990 Unspecified osteoarthritis, unspecified site: Secondary | ICD-10-CM

## 2018-02-26 DIAGNOSIS — Z1389 Encounter for screening for other disorder: Secondary | ICD-10-CM | POA: Diagnosis not present

## 2018-02-26 DIAGNOSIS — Z113 Encounter for screening for infections with a predominantly sexual mode of transmission: Secondary | ICD-10-CM | POA: Diagnosis not present

## 2018-02-26 DIAGNOSIS — Z136 Encounter for screening for cardiovascular disorders: Secondary | ICD-10-CM

## 2018-02-26 DIAGNOSIS — Z0000 Encounter for general adult medical examination without abnormal findings: Secondary | ICD-10-CM

## 2018-02-26 DIAGNOSIS — I7381 Erythromelalgia: Secondary | ICD-10-CM | POA: Diagnosis not present

## 2018-02-26 DIAGNOSIS — Z1329 Encounter for screening for other suspected endocrine disorder: Secondary | ICD-10-CM

## 2018-02-26 DIAGNOSIS — R7982 Elevated C-reactive protein (CRP): Secondary | ICD-10-CM

## 2018-02-26 DIAGNOSIS — Z1383 Encounter for screening for respiratory disorder NEC: Secondary | ICD-10-CM | POA: Diagnosis not present

## 2018-02-26 DIAGNOSIS — Z114 Encounter for screening for human immunodeficiency virus [HIV]: Secondary | ICD-10-CM | POA: Diagnosis not present

## 2018-02-26 DIAGNOSIS — Z0001 Encounter for general adult medical examination with abnormal findings: Secondary | ICD-10-CM

## 2018-02-26 DIAGNOSIS — F418 Other specified anxiety disorders: Secondary | ICD-10-CM

## 2018-02-26 DIAGNOSIS — N921 Excessive and frequent menstruation with irregular cycle: Secondary | ICD-10-CM | POA: Diagnosis not present

## 2018-02-26 DIAGNOSIS — G4700 Insomnia, unspecified: Secondary | ICD-10-CM | POA: Diagnosis not present

## 2018-02-26 LAB — POCT URINALYSIS DIP (MANUAL ENTRY)
Bilirubin, UA: NEGATIVE
Glucose, UA: NEGATIVE mg/dL
Ketones, POC UA: NEGATIVE mg/dL
Leukocytes, UA: NEGATIVE
Nitrite, UA: NEGATIVE
Protein Ur, POC: NEGATIVE mg/dL
SPEC GRAV UA: 1.02 (ref 1.010–1.025)
Urobilinogen, UA: 0.2 E.U./dL
pH, UA: 7 (ref 5.0–8.0)

## 2018-02-26 MED ORDER — SUVOREXANT 10 MG PO TABS: 1.0000 | ORAL_TABLET | Freq: Every day | ORAL | 0 refills | Status: DC

## 2018-02-26 MED ORDER — ESCITALOPRAM OXALATE 20 MG PO TABS: 20.0000 mg | ORAL_TABLET | Freq: Every day | ORAL | 4 refills | Status: DC

## 2018-02-26 MED ORDER — LORAZEPAM 2 MG PO TABS: 2.0000 mg | ORAL_TABLET | Freq: Three times a day (TID) | ORAL | 2 refills | Status: DC | PRN

## 2018-02-26 MED ORDER — CLONAZEPAM 2 MG PO TABS: 2.0000 mg | ORAL_TABLET | Freq: Every evening | ORAL | 2 refills | Status: DC | PRN

## 2018-02-26 MED ORDER — TRAZODONE HCL 100 MG PO TABS: 100.0000 mg | ORAL_TABLET | Freq: Every evening | ORAL | 4 refills | Status: DC | PRN

## 2018-02-26 MED ORDER — ONDANSETRON 8 MG PO TBDP: 8.0000 mg | ORAL_TABLET | Freq: Three times a day (TID) | ORAL | 2 refills | Status: DC | PRN

## 2018-02-26 MED FILL — LORazepam 2 MG TABS: 2 | 10 days supply | Qty: 30 | Fill #0

## 2018-02-26 MED FILL — traZODone HCL 100 MG TABS: 100 | 60 days supply | Qty: 90 | Fill #0

## 2018-02-26 MED FILL — clonazePAM 2 MG TABS: 2 | 30 days supply | Qty: 30 | Fill #0

## 2018-02-26 MED FILL — ONDANSETRON ODT 8 MG TABLET: 8 | 7 days supply | Qty: 20 | Fill #0

## 2018-02-26 NOTE — Telephone Encounter (Signed)
Almyra Free, This pt needs a PA for Belsomra. I started the PA ( KEY: [id number] ) however, it needs the below (in bold)  answered. It is not the typical clinical questions. It is a free form text box for information.  Please advise.  Thank you!  Tanya    No automated clinical questions are available. Please provide all relevant Clinical information pertaining to this PA request such as medical rationale, tried/failed medications, lab values, etc. that support the need for the requested drug or enter N/A if not applicable. (Free text, 2000 characters.)
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
## 2018-02-26 NOTE — Telephone Encounter (Signed)
Questions answered and sent

## 2018-02-26 NOTE — Telephone Encounter (Signed)
Please inform patient that we need her to come back in for lab only visit they didn't have enough blood .   Also let patient know to ask for tegaderm it is in bag for patient to hold her libre on.

## 2018-02-26 NOTE — Patient Instructions (Addendum)
   When iron supplements cause significant constipation in some patients, you may need to switch to a slow release, lower dose supplement like MegaFood Blood Builder (26mg  of elemental iron), Nu-Iron (150mg  of elemental iron), or Slow-Fe (45mg  of elemental iron). Avoid taking antacids, dairy products, tea, or coffee within 2 hours before or after this supplement because they will decrease the iron's absorption.   If you have lab work done today you will be contacted with your lab results within the next 2 weeks.  If you have not heard from [place] then please contact us. The fastest way to get your results is to register for My Chart.   IF you received an x-ray today, you will receive an invoice from [facility]. Please contact [facility] at [phone] with questions or concerns regarding your invoice.   IF you received labwork today, you will receive an invoice from [facility]. Please contact [facility] at [phone] with questions or concerns regarding your invoice.   Our billing staff will not be able to assist you with questions regarding bills from these companies.  You will be contacted with the lab results as soon as they are available. The fastest way to get your results is to activate your My Chart account. Instructions are located on the last page of this paperwork. If you have not heard from [place] regarding the results in 2 weeks, please contact this office.

## 2018-02-27 LAB — COMPREHENSIVE METABOLIC PANEL
ALT: 15 IU/L (ref 0–32)
AST: 14 IU/L (ref 0–40)
Albumin/Globulin Ratio: 2 (ref 1.2–2.2)
Albumin: 4.5 g/dL (ref 3.5–5.5)
Alkaline Phosphatase: 64 IU/L (ref 39–117)
BUN/Creatinine Ratio: 19 (ref 9–23)
BUN: 13 mg/dL (ref 6–24)
Bilirubin Total: <0.2 mg/dL (ref 0.0–1.2)
CO2: 21 mmol/L (ref 20–29)
CREATININE: 0.68 mg/dL (ref 0.57–1.00)
Calcium: 9.4 mg/dL (ref 8.7–10.2)
Chloride: 102 mmol/L (ref 96–106)
GFR calc Af Amer: 120 mL/min/{1.73_m2} (ref >59)
GFR calc non Af Amer: 105 mL/min/{1.73_m2} (ref >59)
Globulin, Total: 2.2 g/dL (ref 1.5–4.5)
Glucose: 102 mg/dL — ABNORMAL HIGH (ref 65–99)
Potassium: 4.6 mmol/L (ref 3.5–5.2)
Sodium: 138 mmol/L (ref 134–144)
Total Protein: 6.7 g/dL (ref 6.0–8.5)

## 2018-02-27 LAB — CBC WITH DIFFERENTIAL/PLATELET
BASOS ABS: 0.1 10*3/uL (ref 0.0–0.2)
Basos: 1 %
EOS (ABSOLUTE): 0.1 10*3/uL (ref 0.0–0.4)
Eos: 2 %
Hematocrit: 38.3 % (ref 34.0–46.6)
Hemoglobin: 12.9 g/dL (ref 11.1–15.9)
Immature Grans (Abs): 0 10*3/uL (ref 0.0–0.1)
Immature Granulocytes: 0 %
Lymphocytes Absolute: 1.7 10*3/uL (ref 0.7–3.1)
Lymphs: 30 %
MCH: 29.8 pg (ref 26.6–33.0)
MCHC: 33.7 g/dL (ref 31.5–35.7)
MCV: 89 fL (ref 79–97)
Monocytes Absolute: 0.4 10*3/uL (ref 0.1–0.9)
Monocytes: 8 %
NEUTROS PCT: 59 %
Neutrophils Absolute: 3.3 10*3/uL (ref 1.4–7.0)
Platelets: 463 10*3/uL — ABNORMAL HIGH (ref 150–450)
RBC: 4.33 x10E6/uL (ref 3.77–5.28)
RDW: 13.1 % (ref 12.3–15.4)
WBC: 5.6 10*3/uL (ref 3.4–10.8)

## 2018-02-27 LAB — TSH: TSH: 1.75 u[IU]/mL (ref 0.450–4.500)

## 2018-02-27 LAB — SEDIMENTATION RATE: Sed Rate: 20 mm/hr (ref 0–32)

## 2018-02-27 LAB — C-REACTIVE PROTEIN: CRP: 11 mg/L — ABNORMAL HIGH (ref 0–10)

## 2018-02-27 LAB — LIPID PANEL
CHOL/HDL RATIO: 2 ratio (ref 0.0–4.4)
Cholesterol, Total: 115 mg/dL (ref 100–199)
HDL: 58 mg/dL (ref >39)
LDL Calculated: 38 mg/dL (ref 0–99)
Triglycerides: 95 mg/dL (ref 0–149)
VLDL CHOLESTEROL CAL: 19 mg/dL (ref 5–40)

## 2018-02-27 LAB — HIV ANTIBODY (ROUTINE TESTING W REFLEX): HIV Screen 4th Generation wRfx: NONREACTIVE

## 2018-02-27 NOTE — Telephone Encounter (Signed)
Pt is aware to come back in for lab only

## 2018-03-02 ENCOUNTER — Encounter: Payer: Self-pay | Admitting: Family Medicine

## 2018-03-06 ENCOUNTER — Telehealth: Payer: Self-pay | Admitting: Family Medicine

## 2018-03-06 NOTE — Telephone Encounter (Signed)
Patient was denied her medication Belsomra by her insurance company. They said she needs to try and fail these following medications in order to get Belsomra approved: zolpidem, zaleplon, and eszopiclone. Denial letter is placed in the providers box at nurses station.

## 2018-03-07 NOTE — Telephone Encounter (Signed)
She has failed zolpidem - had severe adverse reaction to it with hallucinations so it is not medically safe to try either of the similar sister medicines in the same class such as zaleplon (Sonata), and eszopiclone (Lunesta). The zolpidem (Ambien) hx was noted in prior chart notes

## 2018-05-04 MED FILL — clonazePAM 2 MG TABS: 2 | 30 days supply | Qty: 30 | Fill #1

## 2018-05-19 MED FILL — LORazepam 2 MG TABS: 2 | 10 days supply | Qty: 30 | Fill #1

## 2018-05-26 MED FILL — MONTELUKAST SOD 10 MG TAB: 10 | 90 days supply | Qty: 90 | Fill #0

## 2018-05-26 MED FILL — traZODone HCL 100 MG TABS: 100 | 60 days supply | Qty: 90 | Fill #1

## 2018-06-08 MED FILL — ESCITALOPRAM 20 MG TABLET: 20 | 90 days supply | Qty: 90 | Fill #3

## 2018-06-09 ENCOUNTER — Encounter: Payer: Self-pay | Admitting: Adult Health

## 2018-06-09 ENCOUNTER — Ambulatory Visit (INDEPENDENT_AMBULATORY_CARE_PROVIDER_SITE_OTHER): Payer: 59 | Admitting: Adult Health

## 2018-06-09 ENCOUNTER — Other Ambulatory Visit: Payer: Self-pay

## 2018-06-09 DIAGNOSIS — F419 Anxiety disorder, unspecified: Secondary | ICD-10-CM

## 2018-06-09 DIAGNOSIS — J45909 Unspecified asthma, uncomplicated: Secondary | ICD-10-CM | POA: Diagnosis not present

## 2018-06-09 DIAGNOSIS — F5101 Primary insomnia: Secondary | ICD-10-CM | POA: Diagnosis not present

## 2018-06-09 DIAGNOSIS — Z7689 Persons encountering health services in other specified circumstances: Secondary | ICD-10-CM

## 2018-06-09 MED ORDER — BUDESONIDE-FORMOTEROL FUMARATE 160-4.5 MCG/ACT IN AERO: 2.0000 | INHALATION_SPRAY | Freq: Two times a day (BID) | RESPIRATORY_TRACT | 3 refills | Status: DC

## 2018-06-09 MED ORDER — SUVOREXANT 10 MG PO TABS: 1.0000 | ORAL_TABLET | Freq: Every day | ORAL | 0 refills | Status: DC

## 2018-06-09 MED FILL — SYMBICORT 160-4.5 MCG INH: 160-4.5 | 30 days supply | Qty: 10 | Fill #0

## 2018-06-09 NOTE — Progress Notes (Signed)
Virtual Visit via Video Note I connected with Mia Thomas on 06/09/18 at  1:00 PM EDT by a video enabled telemedicine application and verified that I am speaking with the correct person using two identifiers. Location patient: home Location provider:work or home office Persons participating in the virtual visit: patient, provider  I discussed the limitations of evaluation and management by telemedicine and the availability of in person appointments. The patient expressed understanding and agreed to proceed.  Establish Care visit  She is a pleasant 48 year old female who  has a past medical history of Allergy, Anxiety, Asthma, Depression, and Shingles.   She is a former patient of Dr. Delman Cheadle  She is due for her CPE in 12/20.   She is a former [employer]. She has a son who is 80 and a daughter who is a sophomore at [employer]. She also has a 40 year old son who " my adopted child" from an exchange    Acute Concerns: Establish Care  Chronic Issues: Anxiety - takes Lexapro 20  mg daily. Feels as though anxiety and insomnia stem from PTSD from an eye injury she had when she was pregnant. She does have an ativan prescription for when she has a panic attack. Has not had to take for over a year.   Insomnia - Chronic in nature. She is currently prescribed Trazodone 100-150 mg. She feels as though her current dose has stopped working as well as it did in the past. She was prescribed Belsomra in the past but was not able to get this filled as her last PCP left before the PA could be addressed. Currently she will take a half tab of Klonopin. She has issues with falling asleep.   Asthma - Takes Singular daily and uses Symbicort during allergy season. Feels as though this is controlled.   Health Maintenance: Dental -- Routine  Vision -- Routine  Immunizations -- UTD Colonoscopy -- Never had  Mammogram -- Routine  PAP -- Routine   Treatment Team  - Dr.  Phineas Real - GYN    Past Medical History:  Diagnosis Date  . Allergy   . Anxiety   . Asthma   . Depression   . Shingles     Past Surgical History:  Procedure Laterality Date  . CESAREAN SECTION    . CHOLECYSTECTOMY    . EYE SURGERY     repair corneal tare  . knee arthroscopy x 2      Current Outpatient Medications on File Prior to Visit  Medication Sig Dispense Refill  . albuterol (PROVENTIL HFA;VENTOLIN HFA) 108 (90 Base) MCG/ACT inhaler Inhale 2 puffs into the lungs every 4 (four) hours as needed for wheezing or shortness of breath (cough, shortness of breath or wheezing.). 1 Inhaler 11  . clonazePAM (KLONOPIN) 2 MG tablet Take 1 tablet (2 mg total) by mouth at bedtime as needed for anxiety. 30 tablet 2  . escitalopram (LEXAPRO) 20 MG tablet Take 1 tablet (20 mg total) by mouth daily. 90 tablet 4  . estradiol (ESTRACE) 0.1 MG/GM vaginal cream Apply a pea-sized amount to external urethra twice a week to prevent bladder infections 42.5 g 1  . LORazepam (ATIVAN) 2 MG tablet Take 1 tablet (2 mg total) by mouth every 8 (eight) hours as needed for anxiety. 30 tablet 2  . montelukast (SINGULAIR) 10 MG tablet Take 1 tablet (10 mg total) by mouth at bedtime. 90 tablet 2  . ondansetron (  ZOFRAN-ODT) 8 MG disintegrating tablet Take 1 tablet (8 mg total) by mouth every 8 (eight) hours as needed for nausea or vomiting. 20 tablet 2  . Spacer/Aero-Holding Chambers (AEROCHAMBER PLUS) inhaler Use as instructed 1 each 2  . Suvorexant (BELSOMRA) 10 MG TABS Take 1 tablet by mouth at bedtime. 30 tablet 0  . traZODone (DESYREL) 100 MG tablet Take 1-1.5 tablets (100-150 mg total) by mouth at bedtime as needed for sleep. 90 tablet 4   No current facility-administered medications on file prior to visit.     Allergies  Allergen Reactions  . Sulfa Antibiotics     Remo Lipps Johnson"s syndrome    Family History  Problem Relation Age of Onset  . Cancer Mother        skin  . Cancer Father        skin   . Heart disease Father     Social History   Socioeconomic History  . Marital status: Married    Spouse name: Mia Thomas  . Number of children: 3  . Years of education: Not on file  . Highest education level: Not on file  Occupational History  . 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 Occupation: former Oncologist  Social Needs  . Financial resource strain: Not on file  . Food insecurity:    Worry: Not on file    Inability: Not on file  . Transportation needs:    Medical: Not on file    Non-medical: Not on file  Tobacco Use  . Smoking status: Never Smoker  . Smokeless tobacco: Never Used  Substance and Sexual Activity  . Alcohol use: Yes    Alcohol/week: 0.0 standard drinks    Comment: Rare  . Drug use: No  . Sexual activity: Yes    Birth control/protection: Other-see comments    Comment: Vasectomy-1st intercourse 89 yo-1 partner  Lifestyle  . Physical activity:    Days per week: Not on file    Minutes per session: Not on file  . Stress: Not on file  Relationships  . Social connections:    Talks on phone: Not on file    Gets together: Not on file    Attends religious service: Not on file    Active member of club or organization: Not on file    Attends meetings of clubs or organizations: Not on file    Relationship status: Not on file  . Intimate partner violence:    Fear of current or ex partner: Not on file    Emotionally abused: Not on file    Physically abused: Not on file    Forced sexual activity: Not on file  Other Topics Concern  . Not on file  Social History Narrative   Lives with her husband, Mia Thomas a local GI physician and the youngest of their 3 children.   Originally from [place], and moved to [place] from [place] in 2016.   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 She is considering what else  she will do with her time as her children are much less dependent on her.   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    Review of Systems  Constitutional: Negative.   HENT: Negative.   Eyes: Negative.   Respiratory: Negative.   Cardiovascular: Negative.   Gastrointestinal: Negative.   Genitourinary: Negative.   Musculoskeletal: Negative.   Skin: Negative.   Neurological: Negative.   Endo/Heme/Allergies: Negative.   Psychiatric/Behavioral: The patient is nervous/anxious and has insomnia.   All other systems reviewed and are negative.   There were no vitals taken for this visit.  Physical Exam   VITALS per patient if applicable:  GENERAL: alert, oriented, appears well and in no acute distress  HEENT: atraumatic, conjunttiva clear, no obvious abnormalities on inspection of external nose and ears  NECK: normal movements of the head and neck  LUNGS: on inspection no signs of respiratory distress, breathing rate appears normal, no obvious gross SOB, gasping or wheezing  CV: no obvious cyanosis  MS: moves all visible extremities without noticeable abnormality  PSYCH/NEURO: pleasant and cooperative, no obvious depression or anxiety, speech and thought processing grossly intact   Assessment/Plan:  1. Encounter to establish care - Follow up in December/January for CPE  - Follow up sooner if needed  2. Anxiety - Continue with Lexapro   3. Intrinsic asthma  - budesonide-formoterol (SYMBICORT) 160-4.5 MCG/ACT inhaler; Inhale 2 puffs into the lungs 2 (two) times daily.  Dispense: 1 Inhaler; Refill: 3  4. Primary insomnia - Will trial her on Belsomra  - Suvorexant (BELSOMRA) 10 MG TABS; Take 1 tablet by mouth at bedtime.   Dispense: 30 tablet; Refill: 0   I discussed the assessment and treatment plan with the patient. The patient was provided an opportunity to ask questions and all were answered. The patient agreed with the plan and demonstrated an understanding of the instructions.   The patient was advised to call back or seek an in-person evaluation if the symptoms worsen or if the condition fails to improve as anticipated.   Dorothyann Peng, NP

## 2018-06-16 ENCOUNTER — Telehealth: Payer: Self-pay

## 2018-06-16 NOTE — Telephone Encounter (Signed)
PA for Belsomra has been sent to cover my meds.  (Key: [id number])

## 2018-07-07 NOTE — Telephone Encounter (Signed)
Received forms asking for more information. These have been completed and faxed back to [employer].

## 2018-07-09 ENCOUNTER — Encounter: Payer: Self-pay | Admitting: Adult Health

## 2018-07-09 NOTE — Telephone Encounter (Signed)
PA has been denied. Denial letter has been placed in Mia Thomas's folder. Please return to [place] with the course of action once reviewed.

## 2018-07-09 NOTE — Telephone Encounter (Signed)
Just to make a note: I do not want to do the step therapy they are requesting due to the side effects that the medications can cause

## 2018-07-10 ENCOUNTER — Encounter: Payer: Self-pay | Admitting: Adult Health

## 2018-07-10 NOTE — Telephone Encounter (Signed)
Received a fax that the appeal has been received by the insurance company and is under review. Awaiting response.

## 2018-07-10 NOTE — Telephone Encounter (Signed)
I spoke with the patients insurance to start the appeal process. They have opened an appeal but require a letter from the provider stating why it is medically neccessary for the patient to use this medication instead of the covered alternatives.  Please advise.

## 2018-07-10 NOTE — Telephone Encounter (Signed)
Letter has been written by Tommi Rumps and faxed back to the patients insurance company. Awaiting response.

## 2018-07-20 NOTE — Telephone Encounter (Signed)
Received letter that the belsomra has been approved for a maximum of 12 refills from 07/13/2018 to 07/12/2019.    Any questions to call [phone]

## 2018-08-19 MED FILL — LORazepam 2 MG TABS: 2 | 10 days supply | Qty: 30 | Fill #2

## 2018-08-19 MED FILL — clonazePAM 2 MG TABS: 2 | 30 days supply | Qty: 30 | Fill #2

## 2018-08-25 ENCOUNTER — Encounter: Payer: Self-pay | Admitting: Adult Health

## 2018-08-25 ENCOUNTER — Other Ambulatory Visit: Payer: Self-pay | Admitting: Adult Health

## 2018-08-25 MED ORDER — ESCITALOPRAM OXALATE 20 MG PO TABS: 20.0000 mg | ORAL_TABLET | Freq: Every day | ORAL | 1 refills | Status: DC

## 2018-08-25 MED FILL — ESCITALOPRAM 20 MG TABLET: 20 | 90 days supply | Qty: 90 | Fill #0

## 2018-08-26 MED FILL — MONTELUKAST SOD 10 MG TAB: 10 | 90 days supply | Qty: 90 | Fill #1

## 2018-08-26 MED FILL — traZODone HCL 100 MG TABS: 100 | 60 days supply | Qty: 90 | Fill #2

## 2018-09-11 MED FILL — BELSOMRA 10 MG TABLET: 10 | 10 days supply | Qty: 10 | Fill #0

## 2018-10-28 ENCOUNTER — Other Ambulatory Visit: Payer: Self-pay | Admitting: Adult Health

## 2018-10-28 ENCOUNTER — Other Ambulatory Visit: Payer: Self-pay | Admitting: *Deleted

## 2018-10-28 ENCOUNTER — Telehealth: Payer: Self-pay | Admitting: Adult Health

## 2018-10-28 DIAGNOSIS — J029 Acute pharyngitis, unspecified: Secondary | ICD-10-CM

## 2018-10-28 DIAGNOSIS — J09X2 Influenza due to identified novel influenza A virus with other respiratory manifestations: Secondary | ICD-10-CM

## 2018-10-28 MED ORDER — MAGIC MOUTHWASH W/LIDOCAINE: 5.0000 mL | Freq: Three times a day (TID) | ORAL | 0 refills | Status: DC | PRN

## 2018-10-28 MED FILL — CMPD-LIDO/DIPH/MYLAN 1:1:1: 12 days supply | Qty: 180 | Fill #0

## 2018-10-28 NOTE — Telephone Encounter (Signed)
With the patient's husband, over the last few days patient has not been feeling well, fatigue.  Was working in the yard yesterday and woke up this morning with a sore throat and swollen uvula.  She was given IM thyroids and oral Benadryl this morning.  Continues to complain of sore throat.  Send in Magic mouthwash with lidocaine as well as add order for COVID testing off chance that this is not seasonal allergy-like symptoms

## 2018-10-30 ENCOUNTER — Other Ambulatory Visit: Payer: Self-pay | Admitting: Adult Health

## 2018-10-30 LAB — NOVEL CORONAVIRUS, NAA: SARS-CoV-2, NAA: NOT DETECTED

## 2018-10-30 MED ORDER — AMOXICILLIN 500 MG PO CAPS: 500.0000 mg | ORAL_CAPSULE | Freq: Two times a day (BID) | ORAL | 0 refills | Status: AC

## 2018-10-30 MED FILL — AMOXICILLIN 500 MG CAPSULE: 500 | 10 days supply | Qty: 20 | Fill #0

## 2018-11-02 DIAGNOSIS — D2372 Other benign neoplasm of skin of left lower limb, including hip: Secondary | ICD-10-CM | POA: Diagnosis not present

## 2018-11-02 DIAGNOSIS — D2371 Other benign neoplasm of skin of right lower limb, including hip: Secondary | ICD-10-CM | POA: Diagnosis not present

## 2018-11-02 DIAGNOSIS — L814 Other melanin hyperpigmentation: Secondary | ICD-10-CM | POA: Diagnosis not present

## 2018-11-02 DIAGNOSIS — L570 Actinic keratosis: Secondary | ICD-10-CM | POA: Diagnosis not present

## 2018-11-02 DIAGNOSIS — D2272 Melanocytic nevi of left lower limb, including hip: Secondary | ICD-10-CM | POA: Diagnosis not present

## 2018-11-02 DIAGNOSIS — D225 Melanocytic nevi of trunk: Secondary | ICD-10-CM | POA: Diagnosis not present

## 2018-11-02 DIAGNOSIS — L821 Other seborrheic keratosis: Secondary | ICD-10-CM | POA: Diagnosis not present

## 2018-11-24 ENCOUNTER — Encounter: Payer: Self-pay | Admitting: Gynecology

## 2018-12-05 MED FILL — traZODone HCL 100 MG TABS: 100 | 60 days supply | Qty: 90 | Fill #3

## 2018-12-22 ENCOUNTER — Other Ambulatory Visit: Payer: Self-pay | Admitting: Adult Health

## 2018-12-22 DIAGNOSIS — Z1231 Encounter for screening mammogram for malignant neoplasm of breast: Secondary | ICD-10-CM

## 2019-01-06 MED FILL — ESCITALOPRAM 20 MG TABLET: 20 | 90 days supply | Qty: 90 | Fill #1

## 2019-01-08 DIAGNOSIS — H524 Presbyopia: Secondary | ICD-10-CM | POA: Diagnosis not present

## 2019-01-11 ENCOUNTER — Encounter: Payer: Self-pay | Admitting: Adult Health

## 2019-01-14 ENCOUNTER — Telehealth (INDEPENDENT_AMBULATORY_CARE_PROVIDER_SITE_OTHER): Payer: 59 | Admitting: Adult Health

## 2019-01-14 ENCOUNTER — Other Ambulatory Visit: Payer: Self-pay

## 2019-01-14 DIAGNOSIS — G4700 Insomnia, unspecified: Secondary | ICD-10-CM

## 2019-01-14 DIAGNOSIS — M2550 Pain in unspecified joint: Secondary | ICD-10-CM | POA: Diagnosis not present

## 2019-01-14 DIAGNOSIS — F418 Other specified anxiety disorders: Secondary | ICD-10-CM

## 2019-01-14 DIAGNOSIS — J3089 Other allergic rhinitis: Secondary | ICD-10-CM

## 2019-01-14 DIAGNOSIS — J4520 Mild intermittent asthma, uncomplicated: Secondary | ICD-10-CM

## 2019-01-14 MED ORDER — MONTELUKAST SODIUM 10 MG PO TABS: 10.0000 mg | ORAL_TABLET | Freq: Every day | ORAL | 3 refills | Status: DC

## 2019-01-14 MED ORDER — TRAZODONE HCL 100 MG PO TABS: 100.0000 mg | ORAL_TABLET | Freq: Every evening | ORAL | 1 refills | Status: DC | PRN

## 2019-01-14 MED FILL — MONTELUKAST SOD 10 MG TAB: 10 | 90 days supply | Qty: 90 | Fill #0

## 2019-01-14 NOTE — Progress Notes (Signed)
Virtual Visit via Video Note  I connected with Mia Thomas  on 01/14/19 at  2:00 PM EST by a video enabled telemedicine application and verified that I am speaking with the correct person using two identifiers.  Location patient: home Location provider:work or home office Persons participating in the virtual visit: patient, provider  I discussed the limitations of evaluation and management by telemedicine and the availability of in person appointments. The patient expressed understanding and agreed to proceed.   HPI: 48 year old female who is being evaluated today for Cerner of arthritic pain.  She reports that she has been dealing with arthritic pain for some time now but feels as though her symptoms are getting worse.  Patient reports that her symptoms include swelling and joint discomfort in bilateral hands and fingers, pain in her mid back, swelling in both knees but worse in the left, and swelling and joint pain to bilateral feet. At times some of her joints such as her left knee will feel warm.   She also reports growths on her Achilles tendon with some mild discomfort to the tendon as well.  She does not drink alcohol and eats a vegetarian diet.   She has been worked up in the past by her previous PCP at which time her rheumatoid factor as well as ANA came back negative.    She also needs refills of trazodone and Singulair    ROS: See pertinent positives and negatives per HPI.  Past Medical History:  Diagnosis Date  . Allergy   . Anxiety   . Asthma   . Depression   . Shingles   . Stevens-Johnson syndrome ([place])    as a child     Past Surgical History:  Procedure Laterality Date  . CESAREAN SECTION    . CHOLECYSTECTOMY    . CHOLECYSTECTOMY    . EYE SURGERY     repair corneal tare  . knee arthroscopy x 2     Left x 2; right x 1  . WISDOM TOOTH EXTRACTION      Family History  Problem Relation Age of Onset  . Cancer Mother        skin  . Cancer Father    skin  . Heart disease Father      Current Outpatient Medications:  .  albuterol (PROVENTIL HFA;VENTOLIN HFA) 108 (90 Base) MCG/ACT inhaler, Inhale 2 puffs into the lungs every 4 (four) hours as needed for wheezing or shortness of breath (cough, shortness of breath or wheezing.)., Disp: 1 Inhaler, Rfl: 11 .  budesonide-formoterol (SYMBICORT) 160-4.5 MCG/ACT inhaler, Inhale 2 puffs into the lungs 2 (two) times daily., Disp: 1 Inhaler, Rfl: 3 .  clonazePAM (KLONOPIN) 2 MG tablet, Take 1 tablet (2 mg total) by mouth at bedtime as needed for anxiety., Disp: 30 tablet, Rfl: 2 .  escitalopram (LEXAPRO) 20 MG tablet, Take 1 tablet (20 mg total) by mouth daily., Disp: 90 tablet, Rfl: 1 .  LORazepam (ATIVAN) 2 MG tablet, Take 1 tablet (2 mg total) by mouth every 8 (eight) hours as needed for anxiety., Disp: 30 tablet, Rfl: 2 .  montelukast (SINGULAIR) 10 MG tablet, Take 1 tablet (10 mg total) by mouth at bedtime., Disp: 90 tablet, Rfl: 2 .  Spacer/Aero-Holding Chambers (AEROCHAMBER PLUS) inhaler, Use as instructed, Disp: 1 each, Rfl: 2 .  traZODone (DESYREL) 100 MG tablet, Take 1-1.5 tablets (100-150 mg total) by mouth at bedtime as needed for sleep., Disp: 90 tablet, Rfl: 4  EXAM:  VITALS per  patient if applicable:  GENERAL: alert, oriented, appears well and in no acute distress  HEENT: atraumatic, conjunttiva clear, no obvious abnormalities on inspection of external nose and ears  NECK: normal movements of the head and neck  LUNGS: on inspection no signs of respiratory distress, breathing rate appears normal, no obvious gross SOB, gasping or wheezing  CV: no obvious cyanosis  MS: moves all visible extremities without noticeable abnormality  PSYCH/NEURO: pleasant and cooperative, no obvious depression or anxiety, speech and thought processing grossly intact  ASSESSMENT AND PLAN:  Discussed the following assessment and plan:  1. Polyarthralgia - Concern for inflammatory arthritis  including peripheral spondyloarthritis - Patient would like to wait on referral to rheumatology  - DG Foot Complete Right; Future - DG Foot Complete Left; Future - DG Knee 1-2 Views Right; Future - DG Knee 1-2 Views Left; Future - DG Hand Complete Right; Future - DG Hand Complete Left; Future - DG Thoracic Spine W/Swimmers; Future - CBC with Differential/Platelet; Future - CMP; Future - TSH; Future - ANA; Future - Sedimentation Rate; Future - Rheumatoid Factor; Future - Uric Acid; Future - HLA-B27 Antigen; Future  2. Depression with anxiety  - traZODone (DESYREL) 100 MG tablet; Take 1-1.5 tablets (100-150 mg total) by mouth at bedtime as needed for sleep.  Dispense: 90 tablet; Refill: 1  3. Insomnia, unspecified type  - traZODone (DESYREL) 100 MG tablet; Take 1-1.5 tablets (100-150 mg total) by mouth at bedtime as needed for sleep.  Dispense: 90 tablet; Refill: 1  4. Mild intermittent asthma without complication  - montelukast (SINGULAIR) 10 MG tablet; Take 1 tablet (10 mg total) by mouth at bedtime.  Dispense: 90 tablet; Refill: 3  5. Allergic rhinitis due to other allergic trigger, unspecified seasonality  - montelukast (SINGULAIR) 10 MG tablet; Take 1 tablet (10 mg total) by mouth at bedtime.  Dispense: 90 tablet; Refill: 3     I discussed the assessment and treatment plan with the patient. The patient was provided an opportunity to ask questions and all were answered. The patient agreed with the plan and demonstrated an understanding of the instructions.   The patient was advised to call back or seek an in-person evaluation if the symptoms worsen or if the condition fails to improve as anticipated.   Dorothyann Peng, NP

## 2019-01-18 ENCOUNTER — Other Ambulatory Visit: Payer: Self-pay

## 2019-01-19 ENCOUNTER — Encounter: Payer: Self-pay | Admitting: Gynecology

## 2019-01-19 ENCOUNTER — Ambulatory Visit (INDEPENDENT_AMBULATORY_CARE_PROVIDER_SITE_OTHER): Payer: 59

## 2019-01-19 ENCOUNTER — Other Ambulatory Visit (INDEPENDENT_AMBULATORY_CARE_PROVIDER_SITE_OTHER): Payer: 59

## 2019-01-19 ENCOUNTER — Ambulatory Visit (INDEPENDENT_AMBULATORY_CARE_PROVIDER_SITE_OTHER): Payer: 59 | Admitting: Gynecology

## 2019-01-19 ENCOUNTER — Other Ambulatory Visit: Payer: Self-pay | Admitting: Adult Health

## 2019-01-19 VITALS — BP 124/80 | Ht 66.0 in | Wt 238.0 lb

## 2019-01-19 DIAGNOSIS — D259 Leiomyoma of uterus, unspecified: Secondary | ICD-10-CM

## 2019-01-19 DIAGNOSIS — M7989 Other specified soft tissue disorders: Secondary | ICD-10-CM | POA: Diagnosis not present

## 2019-01-19 DIAGNOSIS — M79641 Pain in right hand: Secondary | ICD-10-CM | POA: Diagnosis not present

## 2019-01-19 DIAGNOSIS — M2550 Pain in unspecified joint: Secondary | ICD-10-CM

## 2019-01-19 DIAGNOSIS — Z01419 Encounter for gynecological examination (general) (routine) without abnormal findings: Secondary | ICD-10-CM | POA: Diagnosis not present

## 2019-01-19 DIAGNOSIS — M79642 Pain in left hand: Secondary | ICD-10-CM | POA: Diagnosis not present

## 2019-01-19 DIAGNOSIS — M1711 Unilateral primary osteoarthritis, right knee: Secondary | ICD-10-CM | POA: Diagnosis not present

## 2019-01-19 DIAGNOSIS — M546 Pain in thoracic spine: Secondary | ICD-10-CM | POA: Diagnosis not present

## 2019-01-19 DIAGNOSIS — R600 Localized edema: Secondary | ICD-10-CM | POA: Diagnosis not present

## 2019-01-19 DIAGNOSIS — M1712 Unilateral primary osteoarthritis, left knee: Secondary | ICD-10-CM | POA: Diagnosis not present

## 2019-01-19 LAB — COMPREHENSIVE METABOLIC PANEL
ALT: 18 U/L (ref 0–35)
AST: 18 U/L (ref 0–37)
Albumin: 4.2 g/dL (ref 3.5–5.2)
Alkaline Phosphatase: 54 U/L (ref 39–117)
BUN: 13 mg/dL (ref 6–23)
CO2: 26 mEq/L (ref 19–32)
Calcium: 9.2 mg/dL (ref 8.4–10.5)
Chloride: 104 mEq/L (ref 96–112)
Creatinine, Ser: 0.62 mg/dL (ref 0.40–1.20)
GFR: 102.51 mL/min (ref >60.00)
Glucose, Bld: 103 mg/dL — ABNORMAL HIGH (ref 70–99)
Potassium: 4.6 mEq/L (ref 3.5–5.1)
Sodium: 139 mEq/L (ref 135–145)
Total Bilirubin: 0.3 mg/dL (ref 0.2–1.2)
Total Protein: 6.5 g/dL (ref 6.0–8.3)

## 2019-01-19 LAB — CBC WITH DIFFERENTIAL/PLATELET
Basophils Absolute: 0.1 10*3/uL (ref 0.0–0.1)
Basophils Relative: 1 % (ref 0.0–3.0)
Eosinophils Absolute: 0.1 10*3/uL (ref 0.0–0.7)
Eosinophils Relative: 1.3 % (ref 0.0–5.0)
HCT: 37.9 % (ref 36.0–46.0)
Hemoglobin: 12.6 g/dL (ref 12.0–15.0)
Lymphocytes Relative: 34.5 % (ref 12.0–46.0)
Lymphs Abs: 2 10*3/uL (ref 0.7–4.0)
MCHC: 33.3 g/dL (ref 30.0–36.0)
MCV: 90.7 fl (ref 78.0–100.0)
Monocytes Absolute: 0.4 10*3/uL (ref 0.1–1.0)
Monocytes Relative: 7.3 % (ref 3.0–12.0)
Neutro Abs: 3.2 10*3/uL (ref 1.4–7.7)
Neutrophils Relative %: 55.9 % (ref 43.0–77.0)
Platelets: 439 10*3/uL — ABNORMAL HIGH (ref 150.0–400.0)
RBC: 4.18 Mil/uL (ref 3.87–5.11)
RDW: 13.8 % (ref 11.5–15.5)
WBC: 5.8 10*3/uL (ref 4.0–10.5)

## 2019-01-19 LAB — URIC ACID: Uric Acid, Serum: 5.1 mg/dL (ref 2.4–7.0)

## 2019-01-19 LAB — SEDIMENTATION RATE: Sed Rate: 19 mm/hr (ref 0–20)

## 2019-01-19 LAB — TSH: TSH: 1.62 u[IU]/mL (ref 0.35–4.50)

## 2019-01-19 NOTE — Patient Instructions (Signed)
Follow-up for ultrasound as scheduled.  Follow-up in 1 year for annual exam

## 2019-01-19 NOTE — Progress Notes (Signed)
    Mia Thomas June 18, 1970 [id number]        48 y.o.  G2P2 for annual gynecologic exam.  Without gynecologic complaints.  Past medical history,surgical history, problem list, medications, allergies, family history and social history were all reviewed and documented as reviewed in the EPIC chart.  ROS:  Performed with pertinent positives and negatives included in the history, assessment and plan.   Additional significant findings : None   Exam: [name] assistant Vitals:   01/19/19 0926  BP: 124/80  Weight: 238 lb (108 kg)  Height: 5\' 6"  (1.676 m)   Body mass index is 38.41 kg/m.  General appearance:  Normal affect, orientation and appearance. Skin: Grossly normal HEENT: Without gross lesions.  No cervical or supraclavicular adenopathy. Thyroid normal.  Lungs:  Clear without wheezing, rales or rhonchi Cardiac: RR, without RMG Abdominal:  Soft, nontender, without masses, guarding, rebound, organomegaly or hernia Breasts:  Examined lying and sitting without masses, retractions, discharge or axillary adenopathy. Pelvic:  Ext, BUS, Vagina: Normal  Cervix: Normal  Uterus: Anteverted, normal size, shape and contour, midline and mobile nontender   Adnexa: Without masses or tenderness    Anus and perineum: Normal   Rectovaginal: Normal sphincter tone without palpated masses or tenderness.    Assessment/Plan:  48 y.o. G2P2 female for annual gynecologic exam.  With regular menses, vasectomy birth control  1. 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  Initially visualized 2018 due to pelvic discomfort.  Follow-up MRI was unremarkable.  Follow-up ultrasound last year showed decrease in vascularity noted.  Recommend follow-up ultrasound now for completeness.  If stable then will stop following. 2. Mammography scheduled next week.  Breast exam normal today. 3. Pap smear/HPV 01/2017.  No Pap smear done today.  No history of abnormal Pap smears.  Plan repeat Pap smear/HPV at  5-year interval per current screening guidelines. 4. Health maintenance.  No routine lab work done as patient has this done elsewhere.  Follow-up for ultrasound.  Follow-up in 1 year for annual exam   Anastasio Auerbach MD, 10:03 AM 01/19/2019

## 2019-01-21 ENCOUNTER — Ambulatory Visit (INDEPENDENT_AMBULATORY_CARE_PROVIDER_SITE_OTHER): Payer: 59 | Admitting: Gynecology

## 2019-01-21 ENCOUNTER — Ambulatory Visit (INDEPENDENT_AMBULATORY_CARE_PROVIDER_SITE_OTHER): Payer: 59

## 2019-01-21 ENCOUNTER — Other Ambulatory Visit: Payer: Self-pay

## 2019-01-21 ENCOUNTER — Encounter: Payer: Self-pay | Admitting: Gynecology

## 2019-01-21 ENCOUNTER — Telehealth: Payer: Self-pay | Admitting: Adult Health

## 2019-01-21 VITALS — BP 118/76

## 2019-01-21 DIAGNOSIS — D251 Intramural leiomyoma of uterus: Secondary | ICD-10-CM

## 2019-01-21 DIAGNOSIS — D259 Leiomyoma of uterus, unspecified: Secondary | ICD-10-CM

## 2019-01-21 DIAGNOSIS — M5134 Other intervertebral disc degeneration, thoracic region: Secondary | ICD-10-CM

## 2019-01-21 LAB — HLA-B27 ANTIGEN: HLA-B27 Antigen: NEGATIVE

## 2019-01-21 LAB — ANA: Anti Nuclear Antibody (ANA): NEGATIVE

## 2019-01-21 LAB — RHEUMATOID FACTOR: Rheumatoid fact SerPl-aCnc: <14 IU/mL (ref <14)

## 2019-01-21 NOTE — Telephone Encounter (Signed)
Spoke to patient and informed her of her labs   I would like to order an MRI of thoracic spine since she is still experiencing discomfort.   She will hold off on Rheumatology referral for the time being

## 2019-01-21 NOTE — Patient Instructions (Signed)
Follow-up next year when due for annual exam

## 2019-01-21 NOTE — Progress Notes (Addendum)
    Mia Thomas [birth year] [id number]        48 y.o.  G2P2 presents for follow-up ultrasound.  History of vascular myoma noted 2018.  Had follow-up MRI and then subsequent ultrasound last year which showed stability of the myoma and decreased vascularity on follow-up views.  Had recommended 1 more follow-up ultrasound this year.  Past medical history,surgical history, problem list, medications, allergies, family history and social history were all reviewed and documented in the EPIC chart.  Directed ROS with pertinent positives and negatives documented in the history of present illness/assessment and plan.  Exam: Vitals:   01/21/19 1442  BP: 118/76   General appearance:  Normal  Ultrasound transvaginal shows uterus normal size and echotexture.  3.4 cm fundal myoma noted.  No significant change or vascularity changes since previous ultrasound.  Endometrial echo 3.7 mm.  Right ovary normal.  Left ovary with 3.2 x 2.2 cm simple cyst day 9 of cycle consistent with physiologic change.  Cul-de-sac negative with no free fluid.  Assessment/Plan:  48 y.o. G2P2 with ultrasound showing persistence of a single fundal myoma with normal vascularity in appearance.  Reviewed with patient.  I feel since this is stable since first noted 2 years ago will stop with surveillance ultrasounds and she is comfortable with this.  She will follow-up next year when due for annual exam.    Anastasio Auerbach MD, 3:04 PM 01/21/2019

## 2019-02-09 ENCOUNTER — Other Ambulatory Visit: Payer: Self-pay

## 2019-02-09 ENCOUNTER — Ambulatory Visit
Admission: RE | Admit: 2019-02-09 | Discharge: 2019-02-09 | Disposition: A | Discharge status: Home / Self Care | Payer: 59 | Source: Ambulatory Visit | Attending: Adult Health | Admitting: Adult Health

## 2019-02-09 DIAGNOSIS — M5023 Other cervical disc displacement, cervicothoracic region: Secondary | ICD-10-CM | POA: Diagnosis not present

## 2019-02-09 DIAGNOSIS — M5134 Other intervertebral disc degeneration, thoracic region: Secondary | ICD-10-CM

## 2019-02-10 ENCOUNTER — Other Ambulatory Visit: Payer: Self-pay

## 2019-02-10 ENCOUNTER — Ambulatory Visit
Admission: RE | Admit: 2019-02-10 | Discharge: 2019-02-10 | Disposition: A | Discharge status: Home / Self Care | Payer: 59 | Source: Ambulatory Visit | Attending: Adult Health | Admitting: Adult Health

## 2019-02-10 DIAGNOSIS — Z1231 Encounter for screening mammogram for malignant neoplasm of breast: Secondary | ICD-10-CM

## 2019-02-22 MED FILL — traZODone HCL 100 MG TABS: 100 | 60 days supply | Qty: 90 | Fill #4

## 2019-03-23 ENCOUNTER — Ambulatory Visit: Payer: 59 | Attending: Internal Medicine

## 2019-03-23 DIAGNOSIS — Z23 Encounter for immunization: Secondary | ICD-10-CM | POA: Diagnosis not present

## 2019-03-23 NOTE — Progress Notes (Signed)
   Covid-19 Vaccination Clinic  Name:  Mia Thomas    MRN: [id number] DOB: 04-05-1970  03/23/2019  [name] was observed post Covid-19 immunization for 30 minutes based on pre-vaccination screening without incidence. She was provided with Vaccine Information Sheet and instruction to access the V-Safe system.   [name] was instructed to call 911 with any severe reactions post vaccine: Marland Kitchen Difficulty breathing  . Swelling of your face and throat  . A fast heartbeat  . A bad rash all over your body  . Dizziness and weakness    Immunizations Administered    Name Date Dose VIS Date Route   Pfizer COVID-19 Vaccine 03/23/2019 10:13 AM 0.3 mL 02/12/2019 Intramuscular   Manufacturer: [employer], [employer]   Lot: [id number]   [place]: [id number]

## 2019-04-12 ENCOUNTER — Ambulatory Visit: Payer: 59 | Attending: Internal Medicine

## 2019-04-12 DIAGNOSIS — Z23 Encounter for immunization: Secondary | ICD-10-CM

## 2019-04-12 NOTE — Progress Notes (Signed)
   Covid-19 Vaccination Clinic  Name:  Mia Thomas    MRN: [id number] DOB: Sep 14, 1970  04/12/2019  Mia Thomas was observed post Covid-19 immunization for 30 minutes based on pre-vaccination screening without incidence. She was provided with Vaccine Information Sheet and instruction to access the V-Safe system.   Mia Thomas was instructed to call 911 with any severe reactions post vaccine: Marland Kitchen Difficulty breathing  . Swelling of your face and throat  . A fast heartbeat  . A bad rash all over your body  . Dizziness and weakness    Immunizations Administered    Name Date Dose VIS Date Route   Pfizer COVID-19 Vaccine 04/12/2019  8:50 AM 0.3 mL 02/12/2019 Intramuscular   Manufacturer: [place]   Lot: [id number]   [place]: [id number]

## 2019-05-10 MED FILL — MONTELUKAST SOD 10 MG TAB: 10 | 90 days supply | Qty: 90 | Fill #0

## 2019-06-11 MED FILL — traZODone HCL 100 MG TABS: 100 | 30 days supply | Qty: 45 | Fill #0

## 2019-07-12 DIAGNOSIS — Z20828 Contact with and (suspected) exposure to other viral communicable diseases: Secondary | ICD-10-CM | POA: Diagnosis not present

## 2019-07-27 MED FILL — traZODone HCL 100 MG TABS: 100 | 30 days supply | Qty: 45 | Fill #1

## 2019-08-18 ENCOUNTER — Ambulatory Visit (INDEPENDENT_AMBULATORY_CARE_PROVIDER_SITE_OTHER): Payer: 59 | Admitting: Adult Health

## 2019-08-18 ENCOUNTER — Telehealth: Payer: Self-pay | Admitting: Adult Health

## 2019-08-18 ENCOUNTER — Other Ambulatory Visit: Payer: Self-pay

## 2019-08-18 ENCOUNTER — Encounter: Payer: Self-pay | Admitting: Adult Health

## 2019-08-18 ENCOUNTER — Other Ambulatory Visit: Payer: Self-pay | Admitting: Adult Health

## 2019-08-18 VITALS — BP 110/78 | Temp 98.2°F | Ht 67.0 in | Wt 221.0 lb

## 2019-08-18 DIAGNOSIS — Z23 Encounter for immunization: Secondary | ICD-10-CM | POA: Diagnosis not present

## 2019-08-18 DIAGNOSIS — J45909 Unspecified asthma, uncomplicated: Secondary | ICD-10-CM

## 2019-08-18 DIAGNOSIS — G4700 Insomnia, unspecified: Secondary | ICD-10-CM

## 2019-08-18 DIAGNOSIS — F419 Anxiety disorder, unspecified: Secondary | ICD-10-CM

## 2019-08-18 DIAGNOSIS — Z0000 Encounter for general adult medical examination without abnormal findings: Secondary | ICD-10-CM

## 2019-08-18 LAB — COMPREHENSIVE METABOLIC PANEL
ALT: 16 U/L (ref 0–35)
AST: 16 U/L (ref 0–37)
Albumin: 4.3 g/dL (ref 3.5–5.2)
Alkaline Phosphatase: 55 U/L (ref 39–117)
BUN: 12 mg/dL (ref 6–23)
CO2: 26 mEq/L (ref 19–32)
Calcium: 9.3 mg/dL (ref 8.4–10.5)
Chloride: 105 mEq/L (ref 96–112)
Creatinine, Ser: 0.67 mg/dL (ref 0.40–1.20)
GFR: 93.51 mL/min (ref >60.00)
Glucose, Bld: 96 mg/dL (ref 70–99)
Potassium: 4.9 mEq/L (ref 3.5–5.1)
Sodium: 138 mEq/L (ref 135–145)
Total Bilirubin: 0.3 mg/dL (ref 0.2–1.2)
Total Protein: 6.6 g/dL (ref 6.0–8.3)

## 2019-08-18 LAB — LIPID PANEL
Cholesterol: 104 mg/dL (ref 0–200)
HDL: 48.4 mg/dL (ref >39.00)
LDL Cholesterol: 27 mg/dL (ref 0–99)
NonHDL: 55.73
Total CHOL/HDL Ratio: 2
Triglycerides: 146 mg/dL (ref 0.0–149.0)
VLDL: 29.2 mg/dL (ref 0.0–40.0)

## 2019-08-18 LAB — CBC WITH DIFFERENTIAL/PLATELET
Basophils Absolute: 0 10*3/uL (ref 0.0–0.1)
Basophils Relative: 0.5 % (ref 0.0–3.0)
Eosinophils Absolute: 0.1 10*3/uL (ref 0.0–0.7)
Eosinophils Relative: 1.3 % (ref 0.0–5.0)
HCT: 39.3 % (ref 36.0–46.0)
Hemoglobin: 13.3 g/dL (ref 12.0–15.0)
Lymphocytes Relative: 29.1 % (ref 12.0–46.0)
Lymphs Abs: 1.9 10*3/uL (ref 0.7–4.0)
MCHC: 33.7 g/dL (ref 30.0–36.0)
MCV: 90.4 fl (ref 78.0–100.0)
Monocytes Absolute: 0.4 10*3/uL (ref 0.1–1.0)
Monocytes Relative: 6.6 % (ref 3.0–12.0)
Neutro Abs: 4.2 10*3/uL (ref 1.4–7.7)
Neutrophils Relative %: 62.5 % (ref 43.0–77.0)
Platelets: 385 10*3/uL (ref 150.0–400.0)
RBC: 4.35 Mil/uL (ref 3.87–5.11)
RDW: 13.3 % (ref 11.5–15.5)
WBC: 6.7 10*3/uL (ref 4.0–10.5)

## 2019-08-18 LAB — TSH: TSH: 1.04 u[IU]/mL (ref 0.35–4.50)

## 2019-08-18 LAB — HEMOGLOBIN A1C: Hgb A1c MFr Bld: 6.5 % (ref 4.6–6.5)

## 2019-08-18 MED ORDER — MONTELUKAST SODIUM 10 MG PO TABS: 10.0000 mg | ORAL_TABLET | Freq: Every day | ORAL | 3 refills | Status: DC

## 2019-08-18 MED ORDER — ESCITALOPRAM OXALATE 5 MG PO TABS: 5.0000 mg | ORAL_TABLET | Freq: Every day | ORAL | 1 refills | Status: DC

## 2019-08-18 MED FILL — MONTELUKAST SOD 10 MG TAB: 10 | 90 days supply | Qty: 90 | Fill #0

## 2019-08-18 MED FILL — ESCITALOPRAM 5 MG TABLET: 5 | 90 days supply | Qty: 90 | Fill #0

## 2019-08-18 NOTE — Patient Instructions (Signed)
It was great seeing you today!  We will follow up with you regarding your blood work   Please follow up for nursing visit in 6 months for the second Hep A vaccination

## 2019-08-18 NOTE — Telephone Encounter (Signed)
Updated patient on her labs.  All of her labs are perfect except for her A1c which is 6.5.  She is going to work on lifestyle modifications and follow-up in 3 months

## 2019-08-18 NOTE — Addendum Note (Signed)
Addended by: Miles Costain T on: 08/18/2019 12:52 PM   Modules accepted: Orders

## 2019-08-18 NOTE — Progress Notes (Signed)
Subjective:    Patient ID: Mia Thomas, female    DOB: 1970/04/21, 49 y.o.   MRN: [id number]  HPI Patient presents for yearly preventative medicine examination. She is a pleasant  49 year old female who  has a past medical history of Allergy, Anxiety, Asthma, Depression, Shingles, and Stevens-Johnson syndrome ([place]).  Anxiety -currently prescribed Lexapro 5 mg daily.  She feels well controlled on this medication.  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  Insomnia -chronic in nature.  She is currently prescribed trazodone 100 to 150 mg nightly as needed  Asthma -takes Singulair daily and uses Symbicort during allergy season.  She feels as though her symptoms are controlled  All immunizations and health maintenance protocols were reviewed with the patient and needed orders were placed. She would like to have a Hep A vaccination   Appropriate screening laboratory values were ordered for the patient including screening of hyperlipidemia, renal function and hepatic function.  Medication reconciliation,  past medical history, social history, problem list and allergies were reviewed in detail with the patient  Goals were established with regard to weight loss, exercise, and  diet in compliance with medication. She stays active and eats a heart healthy diet.   Wt Readings from Last 3 Encounters:  08/18/19 221 lb (100.2 kg)  01/19/19 238 lb (108 kg)  02/26/18 231 lb (104.8 kg)   She is up-to-date on routine dental and vision screens as well as mammogram and Pap.  She will be due next year for her colonoscopy.  She has no acute issues    Review of Systems  Constitutional: Negative.   HENT: Negative.   Eyes: Negative.   Respiratory: Negative.   Cardiovascular: Negative.   Gastrointestinal: Negative.   Endocrine: Negative.   Genitourinary: Negative.   Musculoskeletal: Negative.   Skin: Negative.   Allergic/Immunologic: Negative.    Neurological: Negative.   Hematological: Negative.   Psychiatric/Behavioral: Negative.    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   Originally from [place], and moved to [place] from [place] in 2016.   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 She is considering what else she will do with  her time as her children are much less dependent on her.   10/2016: Oldest son at [employer], Facilities manager, daughter [name] starting freshman year at [employer] in the honors program - full academic scholarship, interested in linguistics - graduated from [employer], younger son Roselyn Reef 9th grader at [employer] - singer, some theater.   Social Determinants of Health   Financial Resource Strain:    Difficulty of Paying Living Expenses:   Food Insecurity:    Worried About Charity fundraiser in the Last  Year:    Arboriculturist in the Last Year:   Transportation Needs:    Film/video editor (Medical):    Lack of Transportation (Non-Medical):   Physical Activity:    Days of Exercise per Week:    Minutes of Exercise per Session:   Stress:    Feeling of Stress :   Social Connections:    Frequency of Communication with Friends and Family:    Frequency of Social Gatherings with Friends and Family:    Attends Religious Services:    Active Member of Clubs or Organizations:    Attends Music therapist:    Marital Status:   Intimate Partner Violence:    Fear of Current or Ex-Partner:    Emotionally Abused:    Physically Abused:    Sexually Abused:     Past Surgical History:  Procedure Laterality Date   CESAREAN SECTION     CHOLECYSTECTOMY     CHOLECYSTECTOMY     EYE SURGERY     repair corneal tare   knee arthroscopy x 2     Left x 2; right x 1   WISDOM TOOTH EXTRACTION      Family History  Problem Relation Age of Onset   Cancer Mother        skin   Cancer Father        skin   Heart disease Father     Allergies  Allergen Reactions   Sulfa Antibiotics     Remo Lipps Johnson"s syndrome    Current Outpatient Medications on File Prior to Visit  Medication Sig Dispense Refill   aspirin EC 81 MG tablet Take 81 mg by mouth daily. Swallow whole.     ELDERBERRY PO Take by mouth.     escitalopram (LEXAPRO) 5 MG tablet Take 5 mg by mouth daily.     montelukast (SINGULAIR) 10 MG tablet Take 1 tablet (10 mg total) by mouth at bedtime. 90 tablet 3   Omega-3 Fatty Acids (FISH OIL PO) Take by mouth.     traZODone (DESYREL) 100 MG tablet Take 1-1.5 tablets (100-150 mg total) by mouth at bedtime as needed for sleep. 90 tablet 1   TURMERIC PO Take by mouth.     VITAMIN D PO Take by mouth.     No current facility-administered medications on file prior to visit.    BP 110/78    Temp 98.2 F (36.8 C)    Ht 5\' 7"  (1.702 m)    Wt 221 lb  (100.2 kg)    BMI 34.61 kg/m       Objective:   Physical Exam Vitals and nursing note reviewed.  Constitutional:      General: She is not in acute distress.    Appearance: Normal appearance. She is well-developed. She is not ill-appearing.  HENT:     Head: Normocephalic and atraumatic.     Right Ear: Tympanic membrane, ear canal and external ear  normal. There is no impacted cerumen.     Left Ear: Tympanic membrane, ear canal and external ear normal. There is no impacted cerumen.     Nose: Nose normal. No congestion or rhinorrhea.     Mouth/Throat:     Mouth: Mucous membranes are moist.     Pharynx: Oropharynx is clear. No oropharyngeal exudate or posterior oropharyngeal erythema.  Eyes:     General:        Right eye: No discharge.        Left eye: No discharge.     Extraocular Movements: Extraocular movements intact.     Conjunctiva/sclera: Conjunctivae normal.     Pupils: Pupils are equal, round, and reactive to light.  Neck:     Thyroid: No thyromegaly.     Vascular: No carotid bruit.     Trachea: No tracheal deviation.  Cardiovascular:     Rate and Rhythm: Normal rate and regular rhythm.     Pulses: Normal pulses.     Heart sounds: Normal heart sounds. No murmur heard.  No friction rub. No gallop.   Pulmonary:     Effort: Pulmonary effort is normal. No respiratory distress.     Breath sounds: Normal breath sounds. No stridor. No wheezing, rhonchi or rales.  Chest:     Chest wall: No tenderness.  Abdominal:     General: Abdomen is flat. Bowel sounds are normal. There is no distension.     Palpations: Abdomen is soft. There is no mass.     Tenderness: There is no abdominal tenderness. There is no right CVA tenderness, left CVA tenderness, guarding or rebound.     Hernia: No hernia is present.  Musculoskeletal:        General: No swelling, tenderness, deformity or signs of injury. Normal range of motion.     Cervical back: Normal range of motion and neck supple.      Right lower leg: No edema.     Left lower leg: No edema.  Lymphadenopathy:     Cervical: No cervical adenopathy.  Skin:    General: Skin is warm and dry.     Coloration: Skin is not jaundiced or pale.     Findings: No bruising, erythema, lesion or rash.  Neurological:     General: No focal deficit present.     Mental Status: She is alert and oriented to person, place, and time.     Cranial Nerves: No cranial nerve deficit.     Sensory: No sensory deficit.     Motor: No weakness.     Coordination: Coordination normal.     Gait: Gait normal.     Deep Tendon Reflexes: Reflexes normal.  Psychiatric:        Mood and Affect: Mood normal.        Behavior: Behavior normal.        Thought Content: Thought content normal.        Judgment: Judgment normal.       Assessment & Plan:  1. Routine general medical examination at a health care facility - Follow up in one year or sooner if needed - Continue to stay active and eat a heart healthy diet.  - CBC with Differential/Platelet - Comprehensive metabolic panel - Hemoglobin A1c - Lipid panel - TSH  2. Insomnia, unspecified type - continue with trazodone as directed - CBC with Differential/Platelet - Comprehensive metabolic panel - Hemoglobin A1c - Lipid panel - TSH  3. Anxiety  - CBC with Differential/Platelet -  Comprehensive metabolic panel - Hemoglobin A1c - Lipid panel - TSH - escitalopram (LEXAPRO) 5 MG tablet; Take 1 tablet (5 mg total) by mouth daily.  Dispense: 90 tablet; Refill: 1  4. Intrinsic asthma  - CBC with Differential/Platelet - Comprehensive metabolic panel - Hemoglobin A1c - Lipid panel - TSH - montelukast (SINGULAIR) 10 MG tablet; Take 1 tablet (10 mg total) by mouth at bedtime.  Dispense: 90 tablet; Refill: 3   Dorothyann Peng, NP

## 2019-09-07 MED FILL — traZODone HCL 100 MG TABS: 100 | 30 days supply | Qty: 45 | Fill #2

## 2019-10-22 MED FILL — traZODone HCL 100 MG TABS: 100 | 30 days supply | Qty: 45 | Fill #3

## 2019-11-03 DIAGNOSIS — D2361 Other benign neoplasm of skin of right upper limb, including shoulder: Secondary | ICD-10-CM | POA: Diagnosis not present

## 2019-11-03 DIAGNOSIS — D2371 Other benign neoplasm of skin of right lower limb, including hip: Secondary | ICD-10-CM | POA: Diagnosis not present

## 2019-11-03 DIAGNOSIS — D2271 Melanocytic nevi of right lower limb, including hip: Secondary | ICD-10-CM | POA: Diagnosis not present

## 2019-11-03 DIAGNOSIS — L814 Other melanin hyperpigmentation: Secondary | ICD-10-CM | POA: Diagnosis not present

## 2019-11-03 DIAGNOSIS — L821 Other seborrheic keratosis: Secondary | ICD-10-CM | POA: Diagnosis not present

## 2019-11-03 DIAGNOSIS — D2272 Melanocytic nevi of left lower limb, including hip: Secondary | ICD-10-CM | POA: Diagnosis not present

## 2019-11-03 DIAGNOSIS — D225 Melanocytic nevi of trunk: Secondary | ICD-10-CM | POA: Diagnosis not present

## 2019-11-14 ENCOUNTER — Ambulatory Visit: Payer: 59 | Attending: Internal Medicine

## 2019-11-14 ENCOUNTER — Other Ambulatory Visit: Payer: Self-pay

## 2019-11-14 DIAGNOSIS — Z23 Encounter for immunization: Secondary | ICD-10-CM

## 2019-11-14 NOTE — Progress Notes (Signed)
   Covid-19 Vaccination Clinic  Name:  Mia Thomas    MRN: [id number] DOB: 20-Jun-1970  11/14/2019  Mia Thomas was observed post Covid-19 immunization for 15 minutes without incident. She was provided with Vaccine Information Sheet and instruction to access the V-Safe system.   Mia Thomas was instructed to call 911 with any severe reactions post vaccine: Marland Kitchen Difficulty breathing  . Swelling of face and throat  . A fast heartbeat  . A bad rash all over body  . Dizziness and weakness

## 2019-11-23 ENCOUNTER — Encounter: Payer: Self-pay | Admitting: Adult Health

## 2019-11-23 ENCOUNTER — Ambulatory Visit: Payer: 59 | Admitting: Adult Health

## 2019-11-23 ENCOUNTER — Other Ambulatory Visit: Payer: Self-pay

## 2019-11-23 VITALS — BP 108/76 | HR 71 | Temp 97.9°F | Wt 209.0 lb

## 2019-11-23 DIAGNOSIS — E7439 Other disorders of intestinal carbohydrate absorption: Secondary | ICD-10-CM

## 2019-11-23 LAB — POCT GLYCOSYLATED HEMOGLOBIN (HGB A1C): Hemoglobin A1C: 5.3 % (ref 4.0–5.6)

## 2019-11-23 LAB — POCT GLUCOSE (DEVICE FOR HOME USE): Glucose Fasting, POC: 91 mg/dL (ref 70–99)

## 2019-11-23 NOTE — Progress Notes (Signed)
Subjective:    Patient ID: Mia Thomas, female    DOB: 02/24/1971, 49 y.o.   MRN: [id number]  HPI   49 year old female who  has a past medical history of Allergy, Anxiety, Asthma, Depression, Shingles, and Stevens-Johnson syndrome ([place]).  She presents to the office today for 108-month follow-up regarding glucose intolerance.  During her CPE in June 2021 we were surprised to see that her A1c was 6.5.  She does eat a very healthy diet and stays active with exercise and gardening.  She was eating extensive amounts of fruits and it was believed that the natural sugars in the fruits caused her A1c to increase.  Over the last 3 months she has been eating fish, vegetables, and cheeses.  She has stayed away from fruits as well as wine.  She is also doing intermittent fasting  Wt Readings from Last 3 Encounters:  11/23/19 209 lb (94.8 kg)  08/18/19 221 lb (100.2 kg)  01/19/19 238 lb (108 kg)    Review of Systems See HPI   Past Medical History:  Diagnosis Date  . Allergy   . Anxiety   . Asthma   . Depression   . Shingles   . Stevens-Johnson syndrome ([place])    as a child     Social History   Socioeconomic History  . Marital status: Married    Spouse name: Mia Thomas  . Number of children: 3  . Years of education: Not on file  . Highest education level: Not on file  Occupational History  . 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 Occupation: former Oncologist  Tobacco Use  . Smoking status: Never Smoker  . Smokeless tobacco: Never Used  Vaping Use  . Vaping Use: Never used  Substance and Sexual Activity  . Alcohol use: Yes    Alcohol/week: 0.0 standard drinks    Comment: Rare  . Drug use: No  . Sexual activity: Yes    Birth control/protection: Other-see comments    Comment: Vasectomy-1st intercourse 44 yo-1 partner  Other Topics Concern  . Not on file  Social History Narrative   Lives with her husband, Mia Thomas a local GI physician and the  youngest of their 3 children.   Originally from [place], and moved to [place] from [place] in 2016.   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 She is considering what else she will do with her time as her children are much less dependent on her.   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   Social Determinants of Health   Financial Resource Strain:   . Difficulty of Paying Living Expenses: Not on file  Food Insecurity:   . Worried About Charity fundraiser in the Last Year: Not on file  . Ran Out of Food in the Last Year: Not on file  Transportation Needs:   . Lack of Transportation (Medical): Not on file  . Lack of Transportation (Non-Medical): Not on file  Physical Activity:   . Days of Exercise per Week: Not on file  . Minutes of Exercise per Session: Not on file  Stress:   .  Feeling of Stress : Not on file  Social Connections:   . Frequency of Communication with Friends and Family: Not on file  . Frequency of Social Gatherings with Friends and Family: Not on file  . Attends Religious Services: Not on file  . Active Member of Clubs or Organizations: Not on file  . Attends Archivist Meetings: Not on file  . Marital Status: Not on file  Intimate Partner Violence:   . Fear of Current or Ex-Partner: Not on file  . Emotionally Abused: Not on file  . Physically Abused: Not on file  . Sexually Abused: Not on file    Past Surgical History:  Procedure Laterality Date  . CESAREAN SECTION    . CHOLECYSTECTOMY    . CHOLECYSTECTOMY    . EYE SURGERY     repair corneal tare  . knee arthroscopy x 2      Left x 2; right x 1  . WISDOM TOOTH EXTRACTION      Family History  Problem Relation Age of Onset  . Cancer Mother        skin  . Cancer Father        skin  . Heart disease Father     Allergies  Allergen Reactions  . Sulfa Antibiotics     Remo Lipps Johnson"s syndrome    Current Outpatient Medications on File Prior to Visit  Medication Sig Dispense Refill  . aspirin EC 81 MG tablet Take 81 mg by mouth daily. Swallow whole.    Marland Kitchen ELDERBERRY PO Take by mouth.    . escitalopram (LEXAPRO) 5 MG tablet Take 1 tablet (5 mg total) by mouth daily. 90 tablet 1  . montelukast (SINGULAIR) 10 MG tablet Take 1 tablet (10 mg total) by mouth at bedtime. 90 tablet 3  . Omega-3 Fatty Acids (FISH OIL PO) Take by mouth.    . traZODone (DESYREL) 100 MG tablet Take 1-1.5 tablets (100-150 mg total) by mouth at bedtime as needed for sleep. 90 tablet 1  . TURMERIC PO Take by mouth.    Marland Kitchen VITAMIN D PO Take by mouth.     No current facility-administered medications on file prior to visit.    BP 108/76 (BP Location: Left Arm, Patient Position: Sitting, Cuff Size: Large)   Pulse 71   Temp 97.9 F (36.6 C) (Oral)   Wt 209 lb (94.8 kg)   SpO2 97%   BMI 32.73 kg/m       Objective:   Physical Exam Vitals and nursing note reviewed.  Constitutional:      Appearance: Normal appearance.  Cardiovascular:     Rate and Rhythm: Normal rate and regular rhythm.     Pulses: Normal pulses.     Heart sounds: Normal heart sounds.  Pulmonary:     Effort: Pulmonary effort is normal.     Breath sounds: Normal breath sounds.  Musculoskeletal:        General: Normal range of motion.  Skin:    General: Skin is warm and dry.     Capillary Refill: Capillary refill takes less than 2 seconds.  Neurological:     General: No focal deficit present.     Mental Status: She is alert and oriented to person, place, and time.  Psychiatric:        Mood and Affect: Mood normal.        Behavior: Behavior normal.         Thought Content: Thought content normal.  Judgment: Judgment normal.       Assessment & Plan:  1. Glucose intolerance  - POCT Glucose (Device for Home Use) - 91  - POCT glycosylated hemoglobin (Hb A1C) 5.3  -Her A1c has improved back to normal levels.  She continues to eat a heart healthy diet, she can add fruits, and moderation back to her diet.  Continue to stay active  Dorothyann Peng, NP

## 2019-12-06 MED FILL — ESCITALOPRAM 5 MG TABLET: 5 | 90 days supply | Qty: 90 | Fill #1

## 2019-12-14 ENCOUNTER — Encounter: Payer: Self-pay | Admitting: Adult Health

## 2019-12-14 ENCOUNTER — Other Ambulatory Visit: Payer: Self-pay | Admitting: Adult Health

## 2019-12-14 DIAGNOSIS — G4700 Insomnia, unspecified: Secondary | ICD-10-CM

## 2019-12-14 DIAGNOSIS — F418 Other specified anxiety disorders: Secondary | ICD-10-CM

## 2019-12-14 MED ORDER — TRAZODONE HCL 100 MG PO TABS: 100.0000 mg | ORAL_TABLET | Freq: Every evening | ORAL | 1 refills | Status: DC | PRN

## 2019-12-14 MED FILL — traZODone HCL 100 MG TABS: 100 | 80 days supply | Qty: 120 | Fill #0

## 2019-12-31 ENCOUNTER — Other Ambulatory Visit: Payer: Self-pay | Admitting: Adult Health

## 2019-12-31 MED ORDER — CIPROFLOXACIN-DEXAMETHASONE 0.3-0.1 % OT SUSP: OTIC | 0 refills | Status: DC

## 2019-12-31 MED FILL — CIPROFLOXACIN-DEXAMETHASONE: 0.3-0.1 | 18 days supply | Qty: 8 | Fill #0

## 2020-01-17 MED FILL — MONTELUKAST SOD 10 MG TAB: 10 | 90 days supply | Qty: 90 | Fill #1

## 2020-01-20 ENCOUNTER — Ambulatory Visit (INDEPENDENT_AMBULATORY_CARE_PROVIDER_SITE_OTHER): Payer: 59 | Admitting: Obstetrics and Gynecology

## 2020-01-20 ENCOUNTER — Other Ambulatory Visit: Payer: Self-pay

## 2020-01-20 ENCOUNTER — Encounter: Payer: 59 | Admitting: Gynecology

## 2020-01-20 ENCOUNTER — Other Ambulatory Visit: Payer: Self-pay | Admitting: Obstetrics and Gynecology

## 2020-01-20 ENCOUNTER — Encounter: Payer: Self-pay | Admitting: Obstetrics and Gynecology

## 2020-01-20 VITALS — BP 118/74 | Ht 67.0 in | Wt 208.0 lb

## 2020-01-20 DIAGNOSIS — N924 Excessive bleeding in the premenopausal period: Secondary | ICD-10-CM

## 2020-01-20 DIAGNOSIS — D251 Intramural leiomyoma of uterus: Secondary | ICD-10-CM

## 2020-01-20 DIAGNOSIS — Z01419 Encounter for gynecological examination (general) (routine) without abnormal findings: Secondary | ICD-10-CM | POA: Diagnosis not present

## 2020-01-20 MED ORDER — MEDROXYPROGESTERONE ACETATE 10 MG PO TABS
10.0000 mg | ORAL_TABLET | Freq: Every day | ORAL | 3 refills | Status: DC
Start: 2020-01-20 — End: 2020-03-08

## 2020-01-20 MED FILL — MEDROXYPROGESTERONE 10 MG T: 10 | 30 days supply | Qty: 10 | Fill #0

## 2020-01-20 NOTE — Progress Notes (Signed)
Belenda Alviar 12-16-1970 [id number]  SUBJECTIVE:  49 y.o. G2P2 female for annual routine gynecologic exam. Previous history of very regular 28-day cycles with 7 days of moderate flow.  She has noticed some changes including 45-50 day cycles now, heavy periods last 9 days when she gets it, very heavy for 3 days with passage of large clots. She has no other gynecologic concerns.  Current Outpatient Medications  Medication Sig Dispense Refill  . aspirin EC 81 MG tablet Take 81 mg by mouth daily. Swallow whole.    Marland Kitchen ELDERBERRY PO Take by mouth.    . escitalopram (LEXAPRO) 5 MG tablet Take 1 tablet (5 mg total) by mouth daily. 90 tablet 1  . montelukast (SINGULAIR) 10 MG tablet Take 1 tablet (10 mg total) by mouth at bedtime. 90 tablet 3  . Omega-3 Fatty Acids (FISH OIL PO) Take by mouth.    . traZODone (DESYREL) 100 MG tablet Take 1-1.5 tablets (100-150 mg total) by mouth at bedtime as needed for sleep. 120 tablet 1  . TURMERIC PO Take by mouth.    Marland Kitchen VITAMIN D PO Take by mouth.     No current facility-administered medications for this visit.   Allergies: Sulfa antibiotics  Patient's last menstrual period was 01/13/2020.  Past medical history,surgical history, problem list, medications, allergies, family history and social history were all reviewed and documented as reviewed in the EPIC chart.  ROS: Pertinent positives and negatives as reviewed in HPI   OBJECTIVE:  BP 118/74   Ht 5\' 7"  (1.702 m)   Wt 208 lb (94.3 kg)   LMP 01/13/2020   BMI 32.58 kg/m  The patient appears well, alert, oriented, in no distress. ENT normal.  Neck supple. No cervical or supraclavicular adenopathy or thyromegaly.  Lungs are clear, good air entry, no wheezes, rhonchi or rales. S1 and S2 normal, no murmurs, regular rate and rhythm.  Abdomen soft without tenderness, guarding, mass or organomegaly.  Neurological is normal, no focal findings.  BREAST EXAM: breasts appear normal, no suspicious masses, no  skin or nipple changes or axillary nodes  PELVIC EXAM: VULVA: normal appearing vulva with no masses, tenderness or lesions, VAGINA: normal appearing vagina with normal color and scant menstrual discharge, no lesions, CERVIX: normal appearing cervix without discharge or lesions, UTERUS: uterus is mildly enlarged with palpable right fundal myoma, in total uterus is about 10 weeks size, otherwise normal consistency and nontender, ADNEXA: normal adnexa in size, nontender and no masses Chaperone: [name] present during the examination  ASSESSMENT:  49 y.o. G2P2 here for annual gynecologic exam  PLAN:   1.  Perimenopause.  Discussed expected menstrual period changes and potential for development of vasomotor and other symptoms.  She is having less frequency of periods but they are heavier when she is having them with excessive bleeding on the period, so I have recommended trying a course of cyclic Provera 10 mg daily x10 days starting with the onset of her menses.  Discussed some of the potential side effects and potential for withdrawal bleeding.  She is comfortable with using this regimen so I will send her a prescription for #20 tablets with 3 refills.  Will notify us if she is experiencing any more increased frequency of heavy bleeding/or more irregular patterns with excessive bleeding. 2. Pap smear/HPV 01/2017.  No significant history of abnormal Pap smears.  Next Pap smear due 2023 following the current guidelines recommending the 5 year interval. 3. 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  Follow-up MRI unremarkable.  Had ultrasound 2019 which showed decrease in vascularity.  Ultrasound 2020 showed fibroid to be stable.  Plan is to stop following with imaging for now unless new symptoms develop and just continue to monitor on annual pelvic exam. 4. Contraception.  Partner with vasectomy. 5. Mammogram 02/2019.  Breast exam normal.  Continue with  annual mammograms. 6. Health maintenance.  No labs today as she normally has these completed elsewhere.  Return annually or sooner, prn.  Joseph Pierini MD 01/20/20

## 2020-01-31 ENCOUNTER — Other Ambulatory Visit: Payer: Self-pay | Admitting: Adult Health

## 2020-01-31 DIAGNOSIS — Z1231 Encounter for screening mammogram for malignant neoplasm of breast: Secondary | ICD-10-CM

## 2020-01-31 MED FILL — MEDROXYPROGESTERONE 10 MG T: 10 | 30 days supply | Qty: 10 | Fill #0

## 2020-02-07 DIAGNOSIS — M25552 Pain in left hip: Secondary | ICD-10-CM | POA: Insufficient documentation

## 2020-02-07 DIAGNOSIS — M5459 Other low back pain: Secondary | ICD-10-CM | POA: Diagnosis not present

## 2020-02-17 ENCOUNTER — Ambulatory Visit (INDEPENDENT_AMBULATORY_CARE_PROVIDER_SITE_OTHER): Payer: 59

## 2020-02-17 ENCOUNTER — Ambulatory Visit (INDEPENDENT_AMBULATORY_CARE_PROVIDER_SITE_OTHER): Payer: 59 | Admitting: Orthopaedic Surgery

## 2020-02-17 ENCOUNTER — Encounter: Payer: Self-pay | Admitting: Orthopaedic Surgery

## 2020-02-17 VITALS — Ht 67.0 in | Wt 208.0 lb

## 2020-02-17 DIAGNOSIS — M25559 Pain in unspecified hip: Secondary | ICD-10-CM | POA: Diagnosis not present

## 2020-02-17 DIAGNOSIS — M25552 Pain in left hip: Secondary | ICD-10-CM | POA: Diagnosis not present

## 2020-02-17 NOTE — Progress Notes (Signed)
Office Visit Note   Patient: Harlow Basley           Date of Birth: 09-Feb-1971           MRN: [id number] Visit Date: 02/17/2020              Requested by: Dorothyann Peng, NP [place],  [place] [address] PCP: Dorothyann Peng, NP   Assessment & Plan: Visit Diagnoses:  1. Hip pain   2. Pain in left hip     Plan: Given the significant swelling and bruising around her left hip combined with the significant pain as well as pain in the groin, a MRI of her left hip is warranted at this point to rule out a soft tissue injury.  This needs to be an MRI arthrogram as well to evaluate the hip labrum.  She agrees with this treatment plan.  She will avoid high impact aerobic activities for now and we will see her back in follow-up once we have the MRI.  All questions and concerns were answered and addressed.  Follow-Up Instructions: No follow-ups on file.   Orders:  Orders Placed This Encounter  Procedures  . XR HIP UNILAT W OR W/O PELVIS 1V LEFT   No orders of the defined types were placed in this encounter.     Procedures: No procedures performed   Clinical Data: No additional findings.   Subjective: Chief Complaint  Patient presents with  . Left Hip - Pain  Eliezer Lofts is an active 49 year old female who injured her hip 2 weeks ago while dancing at a wedding. This is her left hip. She felt a pop and even her husband who was bending with her stated that he felt like there was an audible pop as well that he heard. She had severe pain on the lateral aspect of her hip after that and some in the groin. She was seen at an orthopedic urgent care clinic. She is at 2 weeks since that injury. She reports there was significant bruising all around the lateral aspect of her hip and that is dissipated some but it is still visible and she showed me that. She is able to walk without assistive device but does still get pain around her left hip with pivoting activities and with  weightbearing and it has been achy type of pain as well. Prior to this injury she has never had hip issues at all. She is not a diabetic and is very active. She is never had hip surgery before either. She denies any knee issues.  HPI  Review of Systems There is been no other acute change in her medical status. She currently denies a headache, chest pain, shortness of breath, fever, chills, nausea, vomiting  Objective: Vital Signs: Ht 5\' 7"  (1.702 m)   Wt 208 lb (94.3 kg)   BMI 32.58 kg/m   Physical Exam She is alert and orient x3 and in no acute distress Ortho Exam Examination of her left hip shows significant bruising that is subacute around the lateral aspect of the hip itself. Her range of motion is full and there is no blocks to motion. She has 5 out of 5 strength with hip flexion and hip abduction. She does have pain on the lateral aspect of the hip and some in the groin with the extremes of internal and external rotation. She is incredibly tender to palpation along the proximal IT band and trochanteric area of her left hip Specialty Comments:  No  specialty comments available.  Imaging: No results found.   PMFS History: Patient Active Problem List   Diagnosis Date Noted  . Panic anxiety syndrome 10/16/2016  . Perimenopausal symptoms 10/16/2016  . Depression 04/28/2016  . Anxiety 04/28/2016  . 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 Allergic rhinitis 01/08/2016   Past Medical History:  Diagnosis Date  . Allergy   . Anxiety   . Asthma   . Depression   . Scoliosis   . Shingles   . Stevens-Johnson syndrome ([place])    as a child     Family History  Problem Relation Age of Onset  . Cancer Mother        skin  . Cancer Father        skin  . Heart disease Father     Past Surgical History:  Procedure Laterality Date  . CESAREAN SECTION    . CHOLECYSTECTOMY    . CHOLECYSTECTOMY    . EYE SURGERY     repair corneal tare  . knee arthroscopy x 2     Left x 2; right x 1  .  WISDOM TOOTH EXTRACTION     Social History   Occupational History  . 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 Occupation: former Oncologist  Tobacco Use  . Smoking status: Never Smoker  . Smokeless tobacco: Never Used  Vaping Use  . Vaping Use: Never used  Substance and Sexual Activity  . Alcohol use: Yes    Alcohol/week: 0.0 standard drinks    Comment: Rare  . Drug use: No  . Sexual activity: Yes    Birth control/protection: Other-see comments    Comment: Vasectomy-1st intercourse 58 yo-1 partner

## 2020-02-18 ENCOUNTER — Other Ambulatory Visit: Payer: Self-pay

## 2020-02-18 DIAGNOSIS — M25552 Pain in left hip: Secondary | ICD-10-CM

## 2020-02-29 ENCOUNTER — Ambulatory Visit
Admission: RE | Admit: 2020-02-29 | Discharge: 2020-02-29 | Disposition: A | Discharge status: Home / Self Care | Payer: 59 | Source: Ambulatory Visit | Attending: Orthopaedic Surgery | Admitting: Orthopaedic Surgery

## 2020-02-29 ENCOUNTER — Other Ambulatory Visit: Payer: Self-pay | Admitting: Adult Health

## 2020-02-29 ENCOUNTER — Encounter: Payer: Self-pay | Admitting: Adult Health

## 2020-02-29 DIAGNOSIS — M25552 Pain in left hip: Secondary | ICD-10-CM | POA: Diagnosis not present

## 2020-02-29 DIAGNOSIS — F419 Anxiety disorder, unspecified: Secondary | ICD-10-CM

## 2020-02-29 MED ORDER — IOPAMIDOL (ISOVUE-M 200) INJECTION 41%
12.0000 mL | Freq: Once | INTRAMUSCULAR | Status: AC
  Administered 2020-02-29: 12 mL via INTRA_ARTICULAR

## 2020-02-29 MED ORDER — ESCITALOPRAM OXALATE 5 MG PO TABS: 5.0000 mg | ORAL_TABLET | Freq: Every day | ORAL | 1 refills | Status: DC

## 2020-02-29 MED FILL — ESCITALOPRAM 5 MG TABLET: 5 | 90 days supply | Qty: 90 | Fill #0

## 2020-03-08 ENCOUNTER — Other Ambulatory Visit: Payer: Self-pay

## 2020-03-08 ENCOUNTER — Encounter: Payer: Self-pay | Admitting: Physician Assistant

## 2020-03-08 ENCOUNTER — Ambulatory Visit (INDEPENDENT_AMBULATORY_CARE_PROVIDER_SITE_OTHER): Payer: 59 | Admitting: Physician Assistant

## 2020-03-08 DIAGNOSIS — M25552 Pain in left hip: Secondary | ICD-10-CM | POA: Diagnosis not present

## 2020-03-08 NOTE — Progress Notes (Signed)
HPI: Mia Thomas returns today to go over the MRI of her left hip.  She is now approximately 5 weeks status post injury.  This was a injury to her left hip while dancing at a wedding.  She underwent an MRI her left hip on 02/17/2020 which showed no acute fractures no bony abnormalities.  However it did show an intramuscular strain involving the tensor fascia lata and a partial tear of the iliotibial band with soft tissue edema.  No labral tear there was fraying of the anterior superior labrum though. She he states she still having pain in the left hip no real change.  Physical exam: General well-developed well-nourished female no acute distress ambulates without any assistive device.  Impression: Left hip strain  Plan: We will send her to formal physical therapy for tissue manipulation, modalities and gentle range of motion of the hip.  She will follow-up with [place] on as-needed basis.  Questions were encouraged and answered by Dr. Magnus Ivan and myself.

## 2020-03-09 ENCOUNTER — Other Ambulatory Visit: Payer: 59

## 2020-03-09 ENCOUNTER — Ambulatory Visit: Payer: 59 | Admitting: Adult Health

## 2020-03-09 ENCOUNTER — Ambulatory Visit (INDEPENDENT_AMBULATORY_CARE_PROVIDER_SITE_OTHER): Payer: 59

## 2020-03-09 DIAGNOSIS — Z23 Encounter for immunization: Secondary | ICD-10-CM

## 2020-03-09 NOTE — Progress Notes (Signed)
Second Hepatitis A vaccine given.  Patient tolerated well.

## 2020-03-10 ENCOUNTER — Ambulatory Visit
Admission: RE | Admit: 2020-03-10 | Discharge: 2020-03-10 | Disposition: A | Discharge status: Home / Self Care | Payer: 59 | Source: Ambulatory Visit | Attending: Adult Health | Admitting: Adult Health

## 2020-03-10 ENCOUNTER — Other Ambulatory Visit: Payer: Self-pay

## 2020-03-10 DIAGNOSIS — Z1231 Encounter for screening mammogram for malignant neoplasm of breast: Secondary | ICD-10-CM

## 2020-03-13 ENCOUNTER — Ambulatory Visit: Payer: 59 | Admitting: Orthopaedic Surgery

## 2020-03-14 ENCOUNTER — Ambulatory Visit: Payer: 59 | Admitting: Orthopaedic Surgery

## 2020-04-22 MED FILL — MONTELUKAST SOD 10 MG TAB: 10 | 90 days supply | Qty: 90 | Fill #2

## 2020-04-23 MED FILL — traZODone HCL 100 MG TABS: 100 | 80 days supply | Qty: 120 | Fill #1

## 2020-06-04 ENCOUNTER — Other Ambulatory Visit (HOSPITAL_COMMUNITY): Payer: Self-pay

## 2020-06-04 MED FILL — Escitalopram Oxalate Tab 5 MG (Base Equiv): ORAL | 90 days supply | Qty: 90 | Fill #0 | Status: AC

## 2020-07-18 ENCOUNTER — Other Ambulatory Visit (HOSPITAL_COMMUNITY): Payer: Self-pay

## 2020-07-18 MED FILL — Montelukast Sodium Tab 10 MG (Base Equiv): ORAL | 90 days supply | Qty: 90 | Fill #0 | Status: AC

## 2020-08-10 ENCOUNTER — Ambulatory Visit: Payer: 59 | Admitting: Adult Health

## 2020-08-18 ENCOUNTER — Other Ambulatory Visit: Payer: Self-pay | Admitting: Family Medicine

## 2020-08-18 ENCOUNTER — Other Ambulatory Visit (HOSPITAL_COMMUNITY): Payer: Self-pay

## 2020-08-18 MED ORDER — DOXYCYCLINE HYCLATE 100 MG PO TABS
100.0000 mg | ORAL_TABLET | Freq: Two times a day (BID) | ORAL | 0 refills | Status: DC
  Filled 2020-08-18: qty 14, 7d supply, fill #0

## 2020-08-18 MED ORDER — AMOXICILLIN 500 MG PO CAPS
1000.0000 mg | ORAL_CAPSULE | Freq: Two times a day (BID) | ORAL | 0 refills | Status: AC
  Filled 2020-08-18: qty 28, 7d supply, fill #0

## 2020-08-18 NOTE — Progress Notes (Signed)
PCP out of town. Patient's husband contacted for patient with sinus sx over 10 days. Will send in doxy for suspected sinus infection coverage; if not improving will need follow up visit.

## 2020-08-22 ENCOUNTER — Other Ambulatory Visit: Payer: Self-pay | Admitting: Adult Health

## 2020-08-22 DIAGNOSIS — G4700 Insomnia, unspecified: Secondary | ICD-10-CM

## 2020-08-22 DIAGNOSIS — F418 Other specified anxiety disorders: Secondary | ICD-10-CM

## 2020-08-23 ENCOUNTER — Other Ambulatory Visit (HOSPITAL_COMMUNITY): Payer: Self-pay

## 2020-08-23 ENCOUNTER — Other Ambulatory Visit: Payer: Self-pay | Admitting: Adult Health

## 2020-08-23 DIAGNOSIS — G4700 Insomnia, unspecified: Secondary | ICD-10-CM

## 2020-08-23 DIAGNOSIS — F418 Other specified anxiety disorders: Secondary | ICD-10-CM

## 2020-08-24 ENCOUNTER — Other Ambulatory Visit (HOSPITAL_COMMUNITY): Payer: Self-pay

## 2020-08-24 MED ORDER — TRAZODONE HCL 100 MG PO TABS
ORAL_TABLET | ORAL | 0 refills | Status: DC
  Filled 2020-08-24: qty 30, 20d supply, fill #0

## 2020-08-31 ENCOUNTER — Other Ambulatory Visit: Payer: Self-pay

## 2020-09-01 ENCOUNTER — Other Ambulatory Visit: Payer: Self-pay | Admitting: Adult Health

## 2020-09-01 ENCOUNTER — Encounter: Payer: Self-pay | Admitting: Adult Health

## 2020-09-01 ENCOUNTER — Ambulatory Visit: Payer: 59 | Admitting: Adult Health

## 2020-09-01 ENCOUNTER — Other Ambulatory Visit (HOSPITAL_COMMUNITY): Payer: Self-pay

## 2020-09-01 VITALS — BP 120/64 | HR 64 | Temp 98.2°F | Ht 67.0 in | Wt 213.0 lb

## 2020-09-01 DIAGNOSIS — E7439 Other disorders of intestinal carbohydrate absorption: Secondary | ICD-10-CM | POA: Diagnosis not present

## 2020-09-01 DIAGNOSIS — Z1159 Encounter for screening for other viral diseases: Secondary | ICD-10-CM | POA: Diagnosis not present

## 2020-09-01 DIAGNOSIS — J45909 Unspecified asthma, uncomplicated: Secondary | ICD-10-CM | POA: Diagnosis not present

## 2020-09-01 DIAGNOSIS — G4700 Insomnia, unspecified: Secondary | ICD-10-CM | POA: Diagnosis not present

## 2020-09-01 DIAGNOSIS — F419 Anxiety disorder, unspecified: Secondary | ICD-10-CM | POA: Diagnosis not present

## 2020-09-01 DIAGNOSIS — Z1211 Encounter for screening for malignant neoplasm of colon: Secondary | ICD-10-CM

## 2020-09-01 DIAGNOSIS — Z23 Encounter for immunization: Secondary | ICD-10-CM | POA: Diagnosis not present

## 2020-09-01 DIAGNOSIS — Z0000 Encounter for general adult medical examination without abnormal findings: Secondary | ICD-10-CM

## 2020-09-01 MED ORDER — MUPIROCIN 2 % EX OINT
1.0000 "application " | TOPICAL_OINTMENT | Freq: Two times a day (BID) | CUTANEOUS | 0 refills | Status: DC
  Filled 2020-09-01: qty 22, 11d supply, fill #0
  Filled 2020-09-01: qty 10, 5d supply, fill #0

## 2020-09-01 MED ORDER — ESCITALOPRAM OXALATE 5 MG PO TABS
ORAL_TABLET | Freq: Every day | ORAL | 1 refills | Status: DC
  Filled 2020-09-01: qty 90, 90d supply, fill #0
  Filled 2020-12-12: qty 90, 90d supply, fill #1

## 2020-09-01 MED ORDER — MONTELUKAST SODIUM 10 MG PO TABS
ORAL_TABLET | Freq: Every day | ORAL | 3 refills | Status: DC
  Filled 2020-09-01: qty 90, fill #0
  Filled 2020-11-05: qty 90, 90d supply, fill #0
  Filled 2021-02-09: qty 90, 90d supply, fill #1
  Filled 2021-05-11: qty 90, 90d supply, fill #2
  Filled 2021-08-12: qty 90, 90d supply, fill #3

## 2020-09-01 MED ORDER — TRAZODONE HCL 100 MG PO TABS
150.0000 mg | ORAL_TABLET | Freq: Every day | ORAL | 1 refills | Status: DC
  Filled 2020-09-01 – 2020-10-03 (×2): qty 135, 90d supply, fill #0
  Filled 2021-02-10: qty 135, 90d supply, fill #1

## 2020-09-01 NOTE — Addendum Note (Signed)
Addended by: Gwenyth Ober R on: 09/01/2020 02:45 PM   Modules accepted: Orders

## 2020-09-01 NOTE — Progress Notes (Signed)
Subjective:    Patient ID: Mia Thomas, female    DOB: 1970-03-28, 50 y.o.   MRN: [id number]  HPI  Patient presents for yearly preventative medicine examination. She is a avery pleasant 50 year old female who  has a past medical history of Allergy, Anxiety, Asthma, Depression, Scoliosis, Shingles, and Stevens-Johnson syndrome ([place]).  Anxiety -currently prescribed Lexapro 5 mg daily.  She does feel well controlled on this medication.    Insomnia-chronic in nature.  Prescribe trazodone 100 to 150 mg nightly as needed.  Asthma-takes Singulair daily.  She does feel as though her symptoms are well controlled  Glucose Intolerance - has single A1c of 6.5 in 08/2019. She does eat healthy and stays active.  Lab Results  Component Value Date   HGBA1C 5.3 11/23/2019    All immunizations and health maintenance protocols were reviewed with the patient and needed orders were placed.  Appropriate screening laboratory values were ordered for the patient including screening of hyperlipidemia, renal function and hepatic function.   Medication reconciliation,  past medical history, social history, problem list and allergies were reviewed in detail with the patient  Goals were established with regard to weight loss, exercise, and  diet in compliance with medications. She does eat healthy and exercises on a routine basis.   She is up-to-date on routine GYN care and mammograms.  She turned 50, now due for routine colonoscopy  Review of Systems  Constitutional: Negative.   HENT: Negative.    Eyes: Negative.   Respiratory: Negative.    Cardiovascular: Negative.   Gastrointestinal: Negative.   Endocrine: Negative.   Genitourinary: Negative.   Musculoskeletal: Negative.   Skin: Negative.   Allergic/Immunologic: Negative.   Neurological: Negative.   Hematological: Negative.   Psychiatric/Behavioral: Negative.    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   Originally from [place], and moved to [place] from [place] in 2016.   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 She is considering what else she will do with her time as her children are much less dependent on her.   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   Social Determinants of Health   Financial Resource Strain: Not on file  Food Insecurity: Not on file  Transportation Needs: Not on file  Physical Activity: Not on file  Stress: Not on file  Social Connections: Not on file  Intimate Partner Violence: Not on file    Past Surgical History:  Procedure Laterality Date   CESAREAN SECTION      CHOLECYSTECTOMY     CHOLECYSTECTOMY     EYE SURGERY     repair corneal tare   knee arthroscopy x 2     Left x 2; right x 1   WISDOM TOOTH EXTRACTION      Family History  Problem Relation Age of Onset   Cancer Mother        skin   Cancer Father        skin   Heart disease Father     Allergies  Allergen Reactions   Sulfa Antibiotics     Remo Lipps Johnson"s syndrome    Current Outpatient Medications on File Prior to Visit  Medication Sig Dispense Refill   ELDERBERRY PO Take by mouth.     escitalopram (LEXAPRO) 5 MG tablet TAKE 1 TABLET BY MOUTH DAILY. 90 tablet 1   montelukast (SINGULAIR) 10 MG tablet TAKE 1 TABLET BY MOUTH AT BEDTIME 90 tablet 3   Omega-3 Fatty Acids (FISH OIL PO) Take by mouth.     traZODone (DESYREL) 100 MG tablet TAKE 1-1 & 1/2 TABLETS BY MOUTH AT BEDTIME AS NEEDED FOR SLEEP 30 tablet 0   TURMERIC PO Take by mouth.     VITAMIN D PO Take by mouth.     [DISCONTINUED] medroxyPROGESTERone (PROVERA) 10 MG tablet Take 1 tablet (10 mg total) by mouth daily. Start with onset of menses and continue for 10 days 20 tablet 3   No current facility-administered medications on file prior to visit.    There were no vitals taken for this visit.      Objective:   Physical Exam Vitals and nursing note reviewed.  Constitutional:      General: She is not in acute distress.    Appearance: Normal appearance. She is well-developed. She is not ill-appearing.  HENT:     Head: Normocephalic and atraumatic.     Right Ear: Tympanic membrane, ear canal and external ear normal. There is no impacted cerumen.     Left Ear: Tympanic membrane, ear canal and external ear normal. There is no impacted cerumen.     Nose: Nose normal. No congestion or rhinorrhea.     Mouth/Throat:     Mouth: Mucous membranes are moist.     Pharynx: Oropharynx is clear. No oropharyngeal exudate or posterior oropharyngeal erythema.  Eyes:     General:        Right eye: No discharge.        Left eye:  No discharge.     Extraocular Movements: Extraocular movements intact.     Conjunctiva/sclera: Conjunctivae normal.     Pupils: Pupils are equal, round, and reactive to light.  Neck:     Thyroid: No thyromegaly.     Vascular: No carotid bruit.     Trachea: No tracheal deviation.  Cardiovascular:     Rate and Rhythm: Normal rate and regular rhythm.     Pulses: Normal pulses.     Heart sounds: Normal heart sounds.  No murmur heard.   No friction rub. No gallop.  Pulmonary:     Effort: Pulmonary effort is normal. No respiratory distress.     Breath sounds: Normal breath sounds. No stridor. No wheezing, rhonchi or rales.  Chest:     Chest wall: No tenderness.  Abdominal:     General: Abdomen is flat. Bowel sounds are normal. There is no distension.     Palpations: Abdomen is soft. There is no mass.     Tenderness: There is no abdominal tenderness. There is no right CVA tenderness, left CVA tenderness, guarding or rebound.     Hernia: No hernia is present.  Musculoskeletal:        General: No swelling, tenderness, deformity or signs of injury. Normal range of motion.     Cervical back: Normal range of motion and neck supple.     Right lower leg: No edema.     Left lower leg: No edema.  Lymphadenopathy:     Cervical: No cervical adenopathy.  Skin:    General: Skin is warm and dry.     Coloration: Skin is not jaundiced or pale.     Findings: No bruising, erythema, lesion or rash.  Neurological:     General: No focal deficit present.     Mental Status: She is alert and oriented to person, place, and time.     Cranial Nerves: No cranial nerve deficit.     Sensory: No sensory deficit.     Motor: No weakness.     Coordination: Coordination normal.     Gait: Gait normal.     Deep Tendon Reflexes: Reflexes normal.  Psychiatric:        Mood and Affect: Mood normal.        Behavior: Behavior normal.        Thought Content: Thought content normal.        Judgment: Judgment normal.       Assessment & Plan:   1. Routine general medical examination at a health care facility  - CBC with Differential/Platelet; Future - Comprehensive metabolic panel; Future - Hemoglobin A1c; Future - Lipid panel; Future - TSH; Future - mupirocin nasal ointment (BACTROBAN NASAL) 2 %; Place 1 application into the nose 2 (two) times daily. Use one-half of tube in each nostril twice daily for five (5) days. After application, press sides of nose together and gently massage.  Dispense: 10 g; Refill: 0  2. Anxiety  - escitalopram (LEXAPRO) 5 MG tablet; TAKE 1 TABLET BY MOUTH DAILY.  Dispense: 90 tablet; Refill: 1  3. Insomnia, unspecified type  - traZODone (DESYREL) 100 MG tablet; Take 1.5 tablets (150 mg total) by mouth at bedtime.  Dispense: 135 tablet; Refill: 1  4. Glucose intolerance  - CBC with Differential/Platelet; Future - Comprehensive metabolic panel; Future - Hemoglobin A1c; Future - Lipid panel; Future - TSH; Future  5. Intrinsic asthma  - montelukast (SINGULAIR) 10 MG tablet; TAKE 1 TABLET BY MOUTH AT BEDTIME  Dispense: 90 tablet; Refill: 3  6. Colon cancer screening - She will schedule her colonoscopy   7. Need for hepatitis C screening test  - Hep C Antibody; Future  Dorothyann Peng, NP

## 2020-09-01 NOTE — Patient Instructions (Signed)
It was great seeing you today!   Please schedule a lab visit   I have sent in all your medications   Party on!

## 2020-10-03 ENCOUNTER — Other Ambulatory Visit (HOSPITAL_COMMUNITY): Payer: Self-pay

## 2020-11-02 DIAGNOSIS — D2271 Melanocytic nevi of right lower limb, including hip: Secondary | ICD-10-CM | POA: Diagnosis not present

## 2020-11-02 DIAGNOSIS — L814 Other melanin hyperpigmentation: Secondary | ICD-10-CM | POA: Diagnosis not present

## 2020-11-02 DIAGNOSIS — D1724 Benign lipomatous neoplasm of skin and subcutaneous tissue of left leg: Secondary | ICD-10-CM | POA: Diagnosis not present

## 2020-11-02 DIAGNOSIS — L821 Other seborrheic keratosis: Secondary | ICD-10-CM | POA: Diagnosis not present

## 2020-11-02 DIAGNOSIS — D2261 Melanocytic nevi of right upper limb, including shoulder: Secondary | ICD-10-CM | POA: Diagnosis not present

## 2020-11-02 DIAGNOSIS — L570 Actinic keratosis: Secondary | ICD-10-CM | POA: Diagnosis not present

## 2020-11-06 ENCOUNTER — Other Ambulatory Visit (HOSPITAL_COMMUNITY): Payer: Self-pay

## 2020-11-07 ENCOUNTER — Other Ambulatory Visit (HOSPITAL_COMMUNITY): Payer: Self-pay

## 2020-11-18 IMAGING — MG DIGITAL SCREENING BILATERAL MAMMOGRAM WITH TOMO AND CAD
8 series · 8 of 24 positions shown · non-contrast
Comparison: Previous exam(s).

CLINICAL DATA: Screening.

EXAM:
DIGITAL SCREENING BILATERAL MAMMOGRAM WITH TOMO AND CAD

[R CC synth-2D]
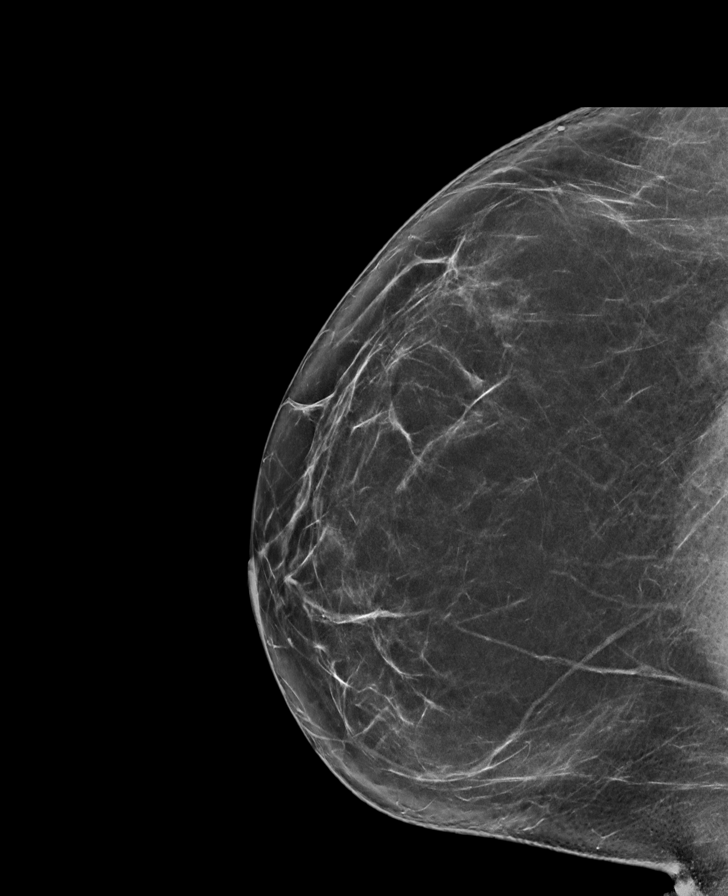

[L CC synth-2D]
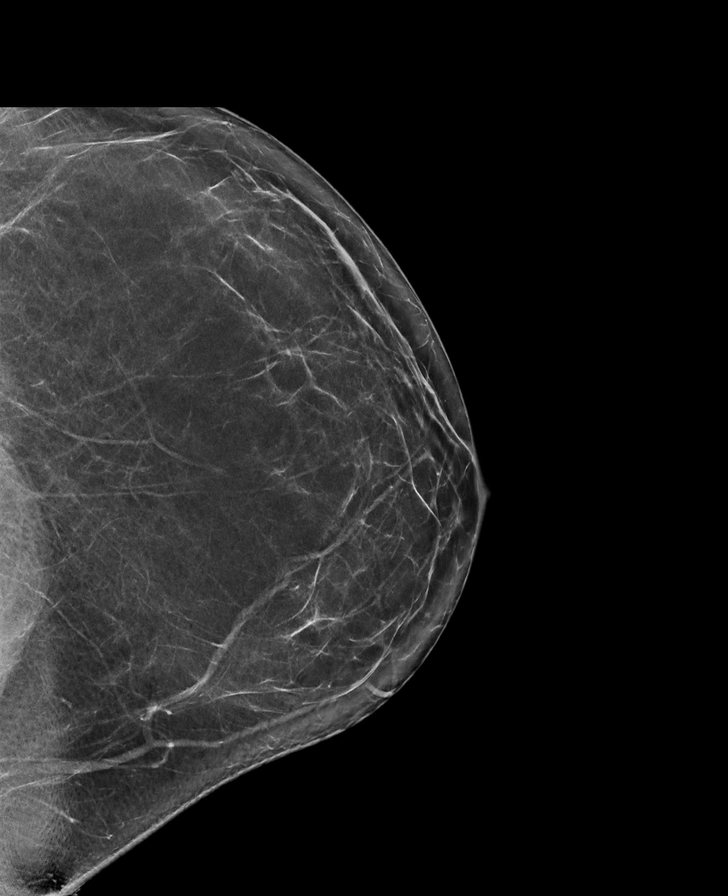

[L MLO synth-2D]
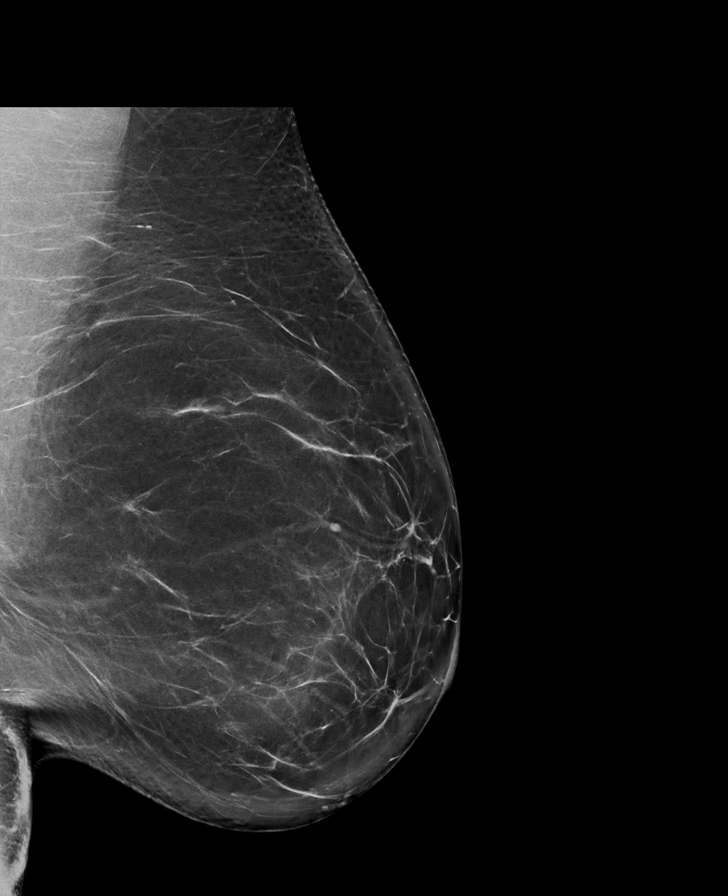

[R MLO synth-2D]
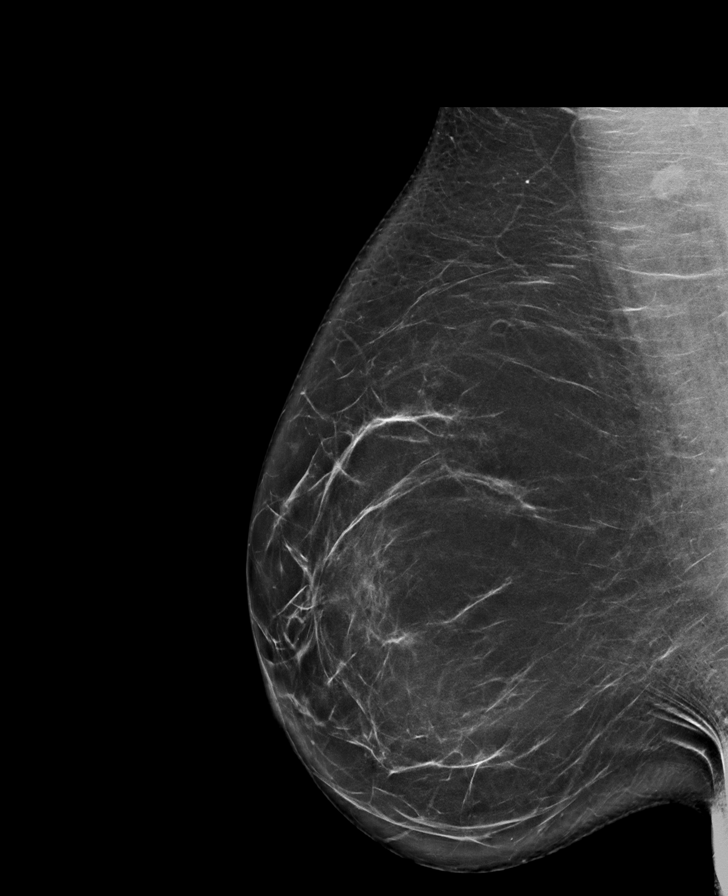

[R CC tomo · tomo slice 42/83.0]
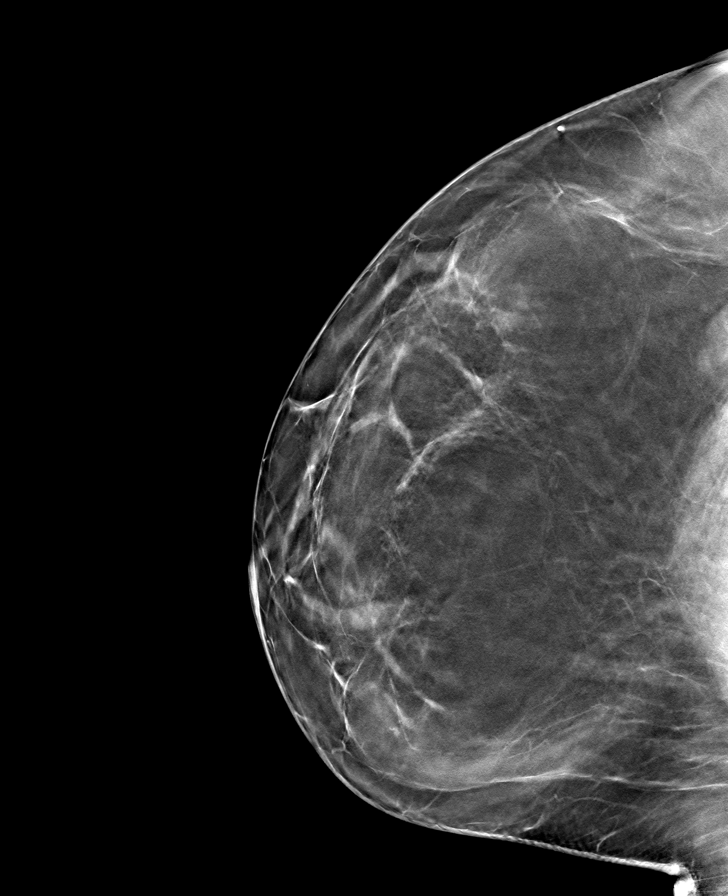

[R MLO tomo · tomo slice 49/96.0]
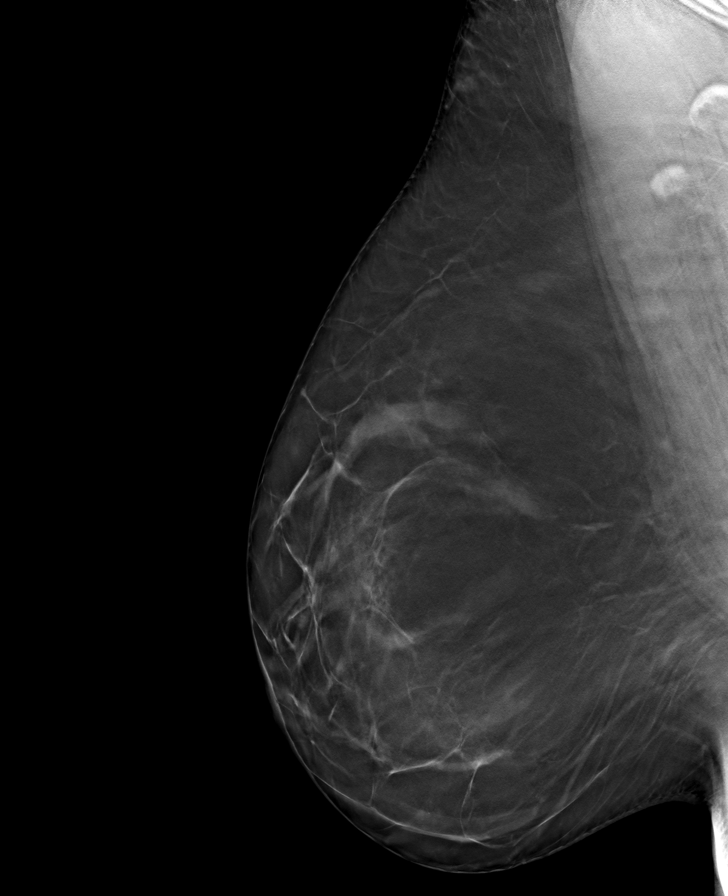

[L MLO tomo · tomo slice 49/98.0]
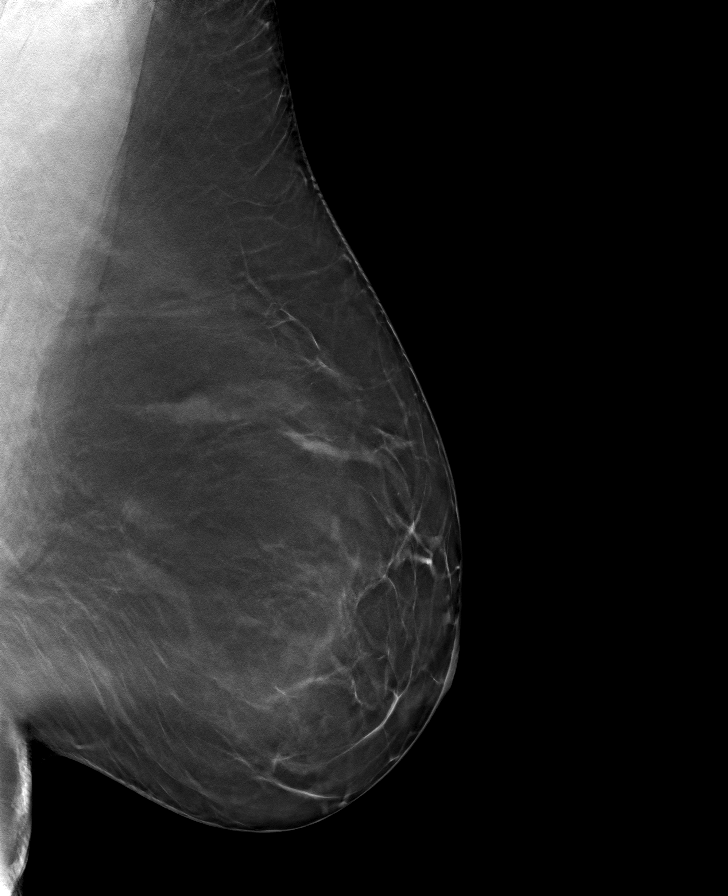

[L CC tomo · tomo slice 41/82.0]
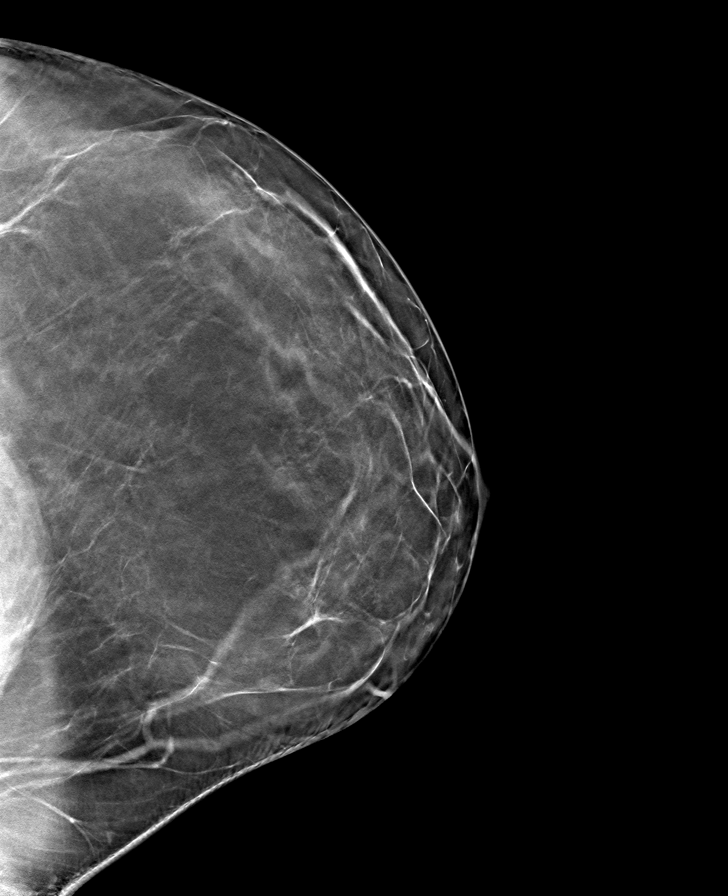

[8 of 24 positions shown; findings below may reference images not displayed]

ACR Breast Density Category b: There are scattered areas of
fibroglandular density.
FINDINGS: There are no findings suspicious for malignancy. Images were
processed with CAD.
IMPRESSION: No mammographic evidence of malignancy. A result letter of this
screening mammogram will be mailed directly to the patient.

RECOMMENDATION:
Screening mammogram in one year. (Code:[id number]-775)

BI-RADS CATEGORY  1: Negative.

## 2020-12-12 ENCOUNTER — Other Ambulatory Visit (HOSPITAL_COMMUNITY): Payer: Self-pay

## 2020-12-19 ENCOUNTER — Ambulatory Visit (INDEPENDENT_AMBULATORY_CARE_PROVIDER_SITE_OTHER): Payer: 59 | Admitting: Family Medicine

## 2020-12-19 ENCOUNTER — Encounter: Payer: Self-pay | Admitting: Family Medicine

## 2020-12-19 ENCOUNTER — Other Ambulatory Visit: Payer: Self-pay

## 2020-12-19 ENCOUNTER — Ambulatory Visit (INDEPENDENT_AMBULATORY_CARE_PROVIDER_SITE_OTHER): Payer: 59

## 2020-12-19 ENCOUNTER — Other Ambulatory Visit (HOSPITAL_COMMUNITY): Payer: Self-pay

## 2020-12-19 VITALS — BP 114/64 | HR 63 | Ht 67.0 in | Wt 220.0 lb

## 2020-12-19 DIAGNOSIS — M533 Sacrococcygeal disorders, not elsewhere classified: Secondary | ICD-10-CM | POA: Diagnosis not present

## 2020-12-19 DIAGNOSIS — M9903 Segmental and somatic dysfunction of lumbar region: Secondary | ICD-10-CM | POA: Diagnosis not present

## 2020-12-19 DIAGNOSIS — M546 Pain in thoracic spine: Secondary | ICD-10-CM | POA: Diagnosis not present

## 2020-12-19 DIAGNOSIS — M549 Dorsalgia, unspecified: Secondary | ICD-10-CM

## 2020-12-19 DIAGNOSIS — M9904 Segmental and somatic dysfunction of sacral region: Secondary | ICD-10-CM

## 2020-12-19 DIAGNOSIS — M545 Low back pain, unspecified: Secondary | ICD-10-CM | POA: Diagnosis not present

## 2020-12-19 MED ORDER — VITAMIN D (ERGOCALCIFEROL) 1.25 MG (50000 UNIT) PO CAPS
50000.0000 [IU] | ORAL_CAPSULE | ORAL | 0 refills | Status: DC
  Filled 2020-12-19: qty 12, 84d supply, fill #0

## 2020-12-19 NOTE — Assessment & Plan Note (Signed)
   Decision today to treat with OMT was based on Physical Exam  After verbal consent patient was treated with HVLA, ME, FPR techniques in  thoracic, lumbar and sacral areas, Patient tolerated the procedure well with improvement in symptoms  Patient given exercises, stretches and lifestyle modifications  See medications in patient instructions if given  Patient will follow up in 4-8 weeks

## 2020-12-19 NOTE — Progress Notes (Signed)
Mia Thomas [address] [place] Phone: [phone] Subjective:   Mia Thomas, am serving as a scribe for Dr. Hulan Thomas. This visit occurred during the SARS-CoV-2 public health emergency.  Safety protocols were in place, including screening questions prior to the visit, additional usage of staff PPE, and extensive cleaning of exam room while observing appropriate contact time as indicated for disinfecting solutions.   I'm seeing this patient by the request  of:  Mia Thomas, Mia Rumps, NP  CC: Low back pain, hip pain  [id number]  Mia Thomas is a 50 y.o. female coming in with complaint of LBP and left hip and pelvis pain. Pain has increased over time. It is constant and dull. Certain movements make the pain sharp. She feels as if her left side is shifting. Hip seems high shoulder seems lower. If walking for a long time starts to feel a snapping in her hip and back as if something is out of place. No numbness or tingling down leg. Was told her had scoliosis about 2 or so years ago.       Past Medical History:  Diagnosis Date   Allergy    Anxiety    Asthma    Depression    Scoliosis    Shingles    Stevens-Johnson syndrome ([place])    as a child    Past Surgical History:  Procedure Laterality Date   CESAREAN SECTION     CHOLECYSTECTOMY     CHOLECYSTECTOMY     EYE SURGERY     repair corneal tare   knee arthroscopy x 2     Left x 2; right x 1   WISDOM TOOTH EXTRACTION     Social History   Socioeconomic History   Marital status: Married    Spouse name: Mia Thomas   Number of children: 3   Years of education: Not on file   Highest education level: Not on file  Occupational History   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   Originally from [place], and moved to [place] from [place] in 2016.   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 She is considering what else she will do with her time as her children are much less dependent on her.   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   Social Determinants of Health   Financial Resource Strain: Not on file  Food Insecurity: Not on file  Transportation Needs: Not on file  Physical Activity: Not on file  Stress: Not on file  Social Connections: Not on file   Allergies  Allergen Reactions   Sulfa Antibiotics     Remo Lipps Johnson"s syndrome   Family History  Problem Relation Age of Onset   Cancer Mother        skin   Cancer Father        skin   Heart disease Father       Current Outpatient Medications (Respiratory):    montelukast (SINGULAIR) 10 MG tablet, TAKE 1 TABLET BY MOUTH AT BEDTIME    Current Outpatient Medications (Other):    Vitamin D, Ergocalciferol, (DRISDOL) 1.25 MG (50000 UNIT) CAPS capsule, Take 1 capsule (50,000 Units total) by mouth every 7 (seven) days.   ELDERBERRY PO, Take by mouth.   escitalopram (LEXAPRO) 5 MG tablet, TAKE 1 TABLET BY MOUTH DAILY.   mupirocin ointment (BACTROBAN) 2 %, Place 1  application into the nose 2 (two) times daily for 5 days. After application, press sides of nose together and gently massage.   Omega-3 Fatty Acids (FISH OIL PO), Take by mouth.   traZODone (DESYREL) 100 MG tablet, Take 1 and 1/2 tablets (150 mg total) by mouth at bedtime.   TURMERIC PO, Take by mouth.   VITAMIN D PO, Take by mouth.   Reviewed prior external information including notes and imaging from  primary care provider As well as notes that were available from care everywhere and other healthcare systems.  Past medical history, social, surgical and family history all reviewed in electronic medical record.  No pertanent information unless stated regarding to the chief complaint.   Review of Systems:  No headache, visual changes, nausea, vomiting, diarrhea, constipation, dizziness, abdominal pain, skin rash, fevers, chills, night sweats, weight loss, swollen lymph nodes, body aches, joint swelling, chest pain, shortness of breath, mood changes. POSITIVE muscle aches  Objective  Blood pressure 114/64, pulse 63, height 5\' 7"  (1.702 m), weight 220 lb (99.8 kg), SpO2 98 %.   General: No apparent distress alert and oriented x3 mood and affect normal, dressed appropriately.  HEENT: Pupils equal, extraocular movements intact  Respiratory: Patient's speak in full sentences and does not appear short of breath  Cardiovascular: No lower extremity edema, non tender, no erythema  Gait normal with good balance and coordination.  MSK:  \Low back exam does have some mild loss of lordosis.  Severe tenderness over the right sacroiliac joint.  Tightness noted in the left paraspinal musculature at the thoracolumbar juncture.  Mild positive FABER test on the left greater than right.  Negative straight leg test.  Patient does have some weakness of the hip abductors bilaterally.  Osteopathic findings  T6 extended rotated and side bent left L2 flexed rotated and side bent left Sacrum right on right      Impression and Recommendations:     The above documentation has been reviewed and is accurate and complete Mia Pulley, DO

## 2020-12-19 NOTE — Assessment & Plan Note (Signed)
Does have what appears to be more of a sacroiliac dysfunction as well as patient has had significant tightness of the hamstring and the hip flexor.  Patient has been diagnosed quite some time.  Discussed with patient icing regimen and home exercises.  We did discuss with patient possibility of laboratory work-up if this continues to give his difficulty.  We will start once weekly vitamin D for muscle strength and endurance.  Follow-up again in 6 to 8 weeks

## 2020-12-19 NOTE — Patient Instructions (Addendum)
Xrays today Do prescribed exercises at least 3x a week [place] over the counter See you again in 4-6 weeks

## 2021-01-08 DIAGNOSIS — H524 Presbyopia: Secondary | ICD-10-CM | POA: Diagnosis not present

## 2021-01-19 ENCOUNTER — Ambulatory Visit: Payer: 59 | Admitting: Family Medicine

## 2021-02-01 ENCOUNTER — Ambulatory Visit: Payer: 59 | Admitting: Obstetrics and Gynecology

## 2021-02-06 NOTE — Progress Notes (Signed)
50 y.o. G2P2 Married Caucasian female here for annual exam.    Menstruation the entire month of August. No bleeding since then.  Not sleeping more than 1 - 2 hours at a time.   Has used Trazodone in the past.   Not hot flashes or night sweats.  Noting vaginal dryness and pain with sex. Used vaginal estrogen in the past, and it did not work.   Feels a "marble" at the vaginal opening.   Feels some pelvic pressure and questions if she has prolapse.  Notices this with gardening.   3 children and has rescue dogs.  Caring for her parents in the house behind them.  Retired Pharmacist, hospital.  Her husband is a GI at [employer].   PCP:   Dorothyann Peng, NP  Patient's last menstrual period was 11/02/2020 (exact date).     Period Duration (Days):  (varies) Period Pattern: (!) Irregular Menstrual Flow: Heavy Menstrual Control: Maxi pad Dysmenorrhea: (!) Severe Dysmenorrhea Symptoms: Cramping, Nausea     Sexually active: Yes.    The current method of family planning is vasectomy.    Exercising: Yes.     walking   and goes to the gym.  Smoker:  no  Health Maintenance: Pap:  01-14-17 neg HPV HR neg History of abnormal Pap:  no MMG:  03-10-20 category b density birads 1:neg Colonoscopy:  none BMD:   none  Result  n/a TDaP:  2022 Gardasil:   n/a HIV: neg 2019 Hep C: not done Screening Labs:  PCP.   reports that she has never smoked. She has never used smokeless tobacco. She reports current alcohol use. She reports that she does not use drugs.  Past Medical History:  Diagnosis Date   Allergy    Anxiety    Asthma    Depression    Scoliosis    Shingles    Stevens-Johnson syndrome ([place])    as a child     Past Surgical History:  Procedure Laterality Date   CESAREAN SECTION     CHOLECYSTECTOMY     CHOLECYSTECTOMY     EYE SURGERY     repair corneal tare   knee arthroscopy x 2     Left x 2; right x 1   WISDOM TOOTH EXTRACTION      Current Outpatient Medications  Medication Sig  Dispense Refill   ELDERBERRY PO Take by mouth.     escitalopram (LEXAPRO) 5 MG tablet TAKE 1 TABLET BY MOUTH DAILY. 90 tablet 1   montelukast (SINGULAIR) 10 MG tablet TAKE 1 TABLET BY MOUTH AT BEDTIME 90 tablet 3   Omega-3 Fatty Acids (FISH OIL PO) Take by mouth.     TART CHERRY PO Take by mouth.     traZODone (DESYREL) 100 MG tablet Take 1 and 1/2 tablets (150 mg total) by mouth at bedtime. 135 tablet 1   TURMERIC PO Take by mouth.     No current facility-administered medications for this visit.    Family History  Problem Relation Age of Onset   Cancer Mother        skin   Cancer Father        skin   Heart disease Father     Review of Systems  Constitutional:        Trouble sleeping  HENT: Negative.    Eyes: Negative.   Respiratory: Negative.    Cardiovascular: Negative.   Gastrointestinal: Negative.   Endocrine: Negative.   Genitourinary: Negative.   Musculoskeletal: Negative.  Skin:        Vaginal cyst, dryness, feels like having prolapse  Allergic/Immunologic: Negative.   Neurological: Negative.   Hematological: Negative.   Psychiatric/Behavioral: Negative.     Exam:   BP 112/68   Pulse 76   Resp 16   Ht 5' 6.25" (1.683 m)   Wt 224 lb (101.6 kg)   LMP 11/02/2020 (Exact Date)   BMI 35.88 kg/m     General appearance: alert, cooperative and appears stated age Head: normocephalic, without obvious abnormality, atraumatic Neck: no adenopathy, supple, symmetrical, trachea midline and thyroid normal to inspection and palpation Lungs: clear to auscultation bilaterally Breasts: normal appearance, no masses or tenderness, No nipple retraction or dimpling, No nipple discharge or bleeding, No axillary adenopathy Heart: regular rate and rhythm Abdomen: soft, non-tender; no masses, no organomegaly Extremities: extremities normal, atraumatic, no cyanosis or edema Skin: skin color, texture, turgor normal. No rashes or lesions Lymph nodes: cervical, supraclavicular, and  axillary nodes normal. Neurologic: grossly normal  Pelvic: External genitalia:  6 mm soft sebaceous cyst of the right labia majora.                No abnormal inguinal nodes palpated.              Urethra:  normal appearing urethra with no masses, tenderness or lesions              Bartholins and Skenes: normal                 Vagina: normal appearing vagina with normal color and discharge, no lesions              Cervix: no lesions              Pap taken: yes Bimanual Exam:  Uterus:  normal size, contour, position, consistency, mobility, non-tender              Adnexa: no mass, fullness, tenderness              Rectal exam: yes.  Confirms.              Anus:  normal sphincter tone, no lesions  Chaperone was present for exam:  Joy, CMA  Assessment:   Well woman visit with gynecologic exam. Perimenopausal female.  Hx uterine fibroid and left simple ovarian cyst.  Sleep disturbance.  Vaginal atrophy.  Sebaceous cyst of the vulva.  Plan: Mammogram screening discussed. Self breast awareness reviewed. Pap and HR HPV as above. Guidelines for Calcium, Vitamin D, regular exercise program including cardiovascular and weight bearing exercise. We discussed vaginal atrophy and tx with water based lubricants, cooking oils, vit E suppositories, and vaginal estrogens.  No Rx for vaginal estrogen given today.  Consider Estroven with melatonin.  She will follow up with her PCP if the sleep disturbance continues.  I suggested possible sleep study.  Warm compress to the vulva and call if it increases in size.  She will do her colonoscopy through [facility]. Follow up annually and prn.

## 2021-02-07 ENCOUNTER — Ambulatory Visit (INDEPENDENT_AMBULATORY_CARE_PROVIDER_SITE_OTHER): Payer: 59 | Admitting: Obstetrics and Gynecology

## 2021-02-07 ENCOUNTER — Encounter: Payer: Self-pay | Admitting: Obstetrics and Gynecology

## 2021-02-07 ENCOUNTER — Other Ambulatory Visit: Payer: Self-pay

## 2021-02-07 ENCOUNTER — Other Ambulatory Visit (HOSPITAL_COMMUNITY)
Admission: RE | Admit: 2021-02-07 | Discharge: 2021-02-07 | Disposition: A | Discharge status: Home / Self Care | Payer: 59 | Source: Ambulatory Visit | Attending: Obstetrics and Gynecology | Admitting: Obstetrics and Gynecology

## 2021-02-07 VITALS — BP 112/68 | HR 76 | Resp 16 | Ht 66.25 in | Wt 224.0 lb

## 2021-02-07 DIAGNOSIS — Z124 Encounter for screening for malignant neoplasm of cervix: Secondary | ICD-10-CM | POA: Diagnosis not present

## 2021-02-07 DIAGNOSIS — Z01419 Encounter for gynecological examination (general) (routine) without abnormal findings: Secondary | ICD-10-CM | POA: Diagnosis not present

## 2021-02-07 NOTE — Patient Instructions (Signed)
EXERCISE AND DIET:  We recommended that you start or continue a regular exercise program for good health. Regular exercise means any activity that makes your heart beat faster and makes you sweat.  We recommend exercising at least 30 minutes per day at least 3 days a week, preferably 4 or 5.  We also recommend a diet low in fat and sugar.  Inactivity, poor dietary choices and obesity can cause diabetes, heart attack, stroke, and kidney damage, among others.    ALCOHOL AND SMOKING:  Women should limit their alcohol intake to no more than 7 drinks/beers/glasses of wine (combined, not each!) per week. Moderation of alcohol intake to this level decreases your risk of breast cancer and liver damage. And of course, no recreational drugs are part of a healthy lifestyle.  And absolutely no smoking or even second hand smoke. Most people know smoking can cause heart and lung diseases, but did you know it also contributes to weakening of your bones? Aging of your skin?  Yellowing of your teeth and nails?  CALCIUM AND VITAMIN D:  Adequate intake of calcium and Vitamin D are recommended.  The recommendations for exact amounts of these supplements seem to change often, but generally speaking 600 mg of calcium (either carbonate or citrate) and 800 units of Vitamin D per day seems prudent. Certain women may benefit from higher intake of Vitamin D.  If you are among these women, your doctor will have told you during your visit.    PAP SMEARS:  Pap smears, to check for cervical cancer or precancers,  have traditionally been done yearly, although recent scientific advances have shown that most women can have pap smears less often.  However, every woman still should have a physical exam from her gynecologist every year. It will include a breast check, inspection of the vulva and vagina to check for abnormal growths or skin changes, a visual exam of the cervix, and then an exam to evaluate the size and shape of the uterus and  ovaries.  And after 50 years of age, a rectal exam is indicated to check for rectal cancers. We will also provide age appropriate advice regarding health maintenance, like when you should have certain vaccines, screening for sexually transmitted diseases, bone density testing, colonoscopy, mammograms, etc.   MAMMOGRAMS:  All women over 40 years old should have a yearly mammogram. Many facilities now offer a "3D" mammogram, which may cost around $50 extra out of pocket. If possible,  we recommend you accept the option to have the 3D mammogram performed.  It both reduces the number of women who will be called back for extra views which then turn out to be normal, and it is better than the routine mammogram at detecting truly abnormal areas.    COLONOSCOPY:  Colonoscopy to screen for colon cancer is recommended for all women at age 50.  We know, you hate the idea of the prep.  We agree, BUT, having colon cancer and not knowing it is worse!!  Colon cancer so often starts as a polyp that can be seen and removed at colonscopy, which can quite literally save your life!  And if your first colonoscopy is normal and you have no family history of colon cancer, most women don't have to have it again for 10 years.  Once every ten years, you can do something that may end up saving your life, right?  We will be happy to help you get it scheduled when you are ready.    Be sure to check your insurance coverage so you understand how much it will cost.  It may be covered as a preventative service at no cost, but you should check your particular policy.    Atrophic Vaginitis Atrophic vaginitis is a condition in which the tissues that line the vagina become dry and thin. This condition is most common in women who have stopped having regular menstrual periods (are in menopause). This usually starts when a woman is 65 to 50 years old. That is the time when a woman's estrogen levels begin to decrease. Estrogen is a female hormone. It  helps to keep the tissues of the vagina moist. It stimulates the vagina to produce a clear fluid that lubricates the vagina for sex. This fluid also protects the vagina from infection. Lack of estrogen can cause the lining of the vagina to get thinner and dryer. The vagina may also shrink in size. It may become less elastic. Atrophic vaginitis tends to get worse over time as a woman's estrogen level drops. What are the causes? This condition is caused by the normal drop in estrogen that happens around the time of menopause. What increases the risk? Certain conditions or situations may lower a woman's estrogen level, leading to a higher risk for atrophic vaginitis. You are more likely to develop this condition if: You are taking medicines that block estrogen. You have had your ovaries removed. You are being treated for cancer with radiation or medicines (chemotherapy). You have given birth or are breastfeeding. You are older than age 64. You smoke. What are the signs or symptoms? Symptoms of this condition include: Pain, soreness, a feeling of pressure, or bleeding during sex (dyspareunia). Vaginal burning, irritation, or itching. Pain or bleeding when a speculum is used in a vaginal exam. Having burning pain while urinating. Vaginal discharge. In some cases, there are no symptoms. How is this diagnosed? This condition is diagnosed based on your medical history and a physical exam. This will include a pelvic exam that checks the vaginal tissues. Though rare, you may also have other tests, including: A urine test. A test that checks the acid balance in your vagina (acid balance test). How is this treated? Treatment for this condition depends on how severe your symptoms are. Treatment may include: Using an over-the-counter vaginal lubricant before sex. Using a long-acting vaginal moisturizer. Using low-dose estrogen for moderate to severe symptoms that do not respond to other treatments.  Options include creams, tablets, and inserts (vaginal rings). Before you use a vaginal estrogen, tell your health care provider if you have a history of: Breast cancer. Endometrial cancer. Blood clots. If you are not sexually active and your symptoms are very mild, you may not need treatment. Follow these instructions at home: Medicines Take over-the-counter and prescription medicines only as told by your health care provider. Do not use herbal or alternative medicines unless your health care provider says that you can. Use over-the-counter creams, lubricants, or moisturizers for dryness only as told by your health care provider. General instructions If your atrophic vaginitis is caused by menopause, discuss all of your menopause symptoms and treatment options with your health care provider. Do not douche. Do not use products that can make your vagina dry. These include: Scented feminine sprays. Scented tampons. Scented soaps. Vaginal sex can help to improve blood flow and elasticity of vaginal tissue. If you choose to have sex and it hurts, try using a water-soluble lubricant or moisturizer right before having sex. Contact a  health care provider if: Your discharge looks different than normal. Your vagina has an unusual smell. You have new symptoms. Your symptoms do not improve with treatment. Your symptoms get worse. Summary Atrophic vaginitis is a condition in which the tissues that line the vagina become dry and thin. It is most common in women who have stopped having regular menstrual periods (are in menopause). Treatment options include using vaginal lubricants and low-dose vaginal estrogen. Contact a health care provider if your vagina has an unusual smell, or if your symptoms get worse or do not improve after treatment. This information is not intended to replace advice given to you by your health care provider. Make sure you discuss any questions you have with your health care  provider. Document Revised: 08/19/2019 Document Reviewed: 08/19/2019 Elsevier Patient Education  [place].

## 2021-02-08 LAB — CYTOLOGY - PAP
Adequacy: ABSENT
Comment: NEGATIVE
Diagnosis: NEGATIVE
High risk HPV: NEGATIVE

## 2021-02-09 ENCOUNTER — Other Ambulatory Visit (HOSPITAL_COMMUNITY): Payer: Self-pay

## 2021-02-09 ENCOUNTER — Other Ambulatory Visit: Payer: Self-pay | Admitting: Adult Health

## 2021-02-09 DIAGNOSIS — Z1231 Encounter for screening mammogram for malignant neoplasm of breast: Secondary | ICD-10-CM

## 2021-02-10 ENCOUNTER — Other Ambulatory Visit (HOSPITAL_COMMUNITY): Payer: Self-pay

## 2021-02-27 ENCOUNTER — Ambulatory Visit: Payer: 59 | Admitting: Family Medicine

## 2021-03-14 ENCOUNTER — Ambulatory Visit
Admission: RE | Admit: 2021-03-14 | Discharge: 2021-03-14 | Disposition: A | Discharge status: Home / Self Care | Payer: 59 | Source: Ambulatory Visit | Attending: Adult Health | Admitting: Adult Health

## 2021-03-14 DIAGNOSIS — Z1231 Encounter for screening mammogram for malignant neoplasm of breast: Secondary | ICD-10-CM

## 2021-03-17 ENCOUNTER — Other Ambulatory Visit: Payer: Self-pay | Admitting: Adult Health

## 2021-03-17 ENCOUNTER — Other Ambulatory Visit (HOSPITAL_COMMUNITY): Payer: Self-pay

## 2021-03-17 DIAGNOSIS — F419 Anxiety disorder, unspecified: Secondary | ICD-10-CM

## 2021-03-20 ENCOUNTER — Other Ambulatory Visit (HOSPITAL_COMMUNITY): Payer: Self-pay

## 2021-03-20 MED ORDER — ESCITALOPRAM OXALATE 5 MG PO TABS
ORAL_TABLET | Freq: Every day | ORAL | 1 refills | Status: DC
  Filled 2021-03-20: qty 90, 90d supply, fill #0
  Filled 2021-06-16: qty 90, 90d supply, fill #1

## 2021-04-06 ENCOUNTER — Other Ambulatory Visit (HOSPITAL_COMMUNITY): Payer: Self-pay

## 2021-04-06 ENCOUNTER — Other Ambulatory Visit: Payer: Self-pay | Admitting: Adult Health

## 2021-04-06 MED ORDER — FLUCONAZOLE 150 MG PO TABS
150.0000 mg | ORAL_TABLET | Freq: Once | ORAL | 3 refills | Status: AC
  Filled 2021-04-06: qty 1, 1d supply, fill #0
  Filled 2021-05-04: qty 1, 1d supply, fill #1

## 2021-05-04 ENCOUNTER — Other Ambulatory Visit (HOSPITAL_COMMUNITY): Payer: Self-pay

## 2021-05-09 ENCOUNTER — Encounter: Payer: Self-pay | Admitting: Adult Health

## 2021-05-11 ENCOUNTER — Other Ambulatory Visit (HOSPITAL_COMMUNITY): Payer: Self-pay

## 2021-05-21 ENCOUNTER — Ambulatory Visit: Payer: 59

## 2021-06-01 ENCOUNTER — Ambulatory Visit: Payer: 59

## 2021-06-06 ENCOUNTER — Telehealth (INDEPENDENT_AMBULATORY_CARE_PROVIDER_SITE_OTHER): Payer: 59 | Admitting: Adult Health

## 2021-06-06 ENCOUNTER — Encounter: Payer: Self-pay | Admitting: Adult Health

## 2021-06-06 ENCOUNTER — Other Ambulatory Visit (HOSPITAL_COMMUNITY): Payer: Self-pay

## 2021-06-06 VITALS — Ht 66.5 in | Wt 224.0 lb

## 2021-06-06 DIAGNOSIS — F419 Anxiety disorder, unspecified: Secondary | ICD-10-CM | POA: Diagnosis not present

## 2021-06-06 DIAGNOSIS — F4321 Adjustment disorder with depressed mood: Secondary | ICD-10-CM | POA: Diagnosis not present

## 2021-06-06 MED ORDER — CLONAZEPAM 1 MG PO TABS
0.5000 mg | ORAL_TABLET | Freq: Two times a day (BID) | ORAL | 0 refills | Status: DC | PRN
  Filled 2021-06-06: qty 30, 15d supply, fill #0

## 2021-06-06 NOTE — Progress Notes (Signed)
Virtual Visit via Video Note ? ?I connected with Mia Thomas on 06/06/21 at 11:30 AM EDT by a video enabled telemedicine application and verified that I am speaking with the correct person using two identifiers. ? Location patient: home ?Location provider:work or home office ?Persons participating in the virtual visit: patient, provider ? ?I discussed the limitations of evaluation and management by telemedicine and the availability of in person appointments. The patient expressed understanding and agreed to proceed. ? ? ?HPI: ?51 year old female who is being evaluated today for anxiety and grief reaction.  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  She is dealing with grief with him being gone, continued care of her mother, and all the emotions that presented with his passing.  She does feel overwhelmed.  She is still taking her Lexapro 5 mg daily.  She has not been able to sleep well over the last few nights as her mind is constantly racing and picturing her father with agonal breathing before his passing causes extreme anxiety for her.  He is interested in talk therapy. ? ? ?ROS: See pertinent positives and negatives per HPI. ? ?Past Medical History:  ?Diagnosis Date  ? Allergy   ? Anxiety   ? Asthma   ? Depression   ? Scoliosis   ? Shingles   ? Stevens-Johnson syndrome ([place])   ? as a child   ? ? ?Past Surgical History:  ?Procedure Laterality Date  ? CESAREAN SECTION    ? CHOLECYSTECTOMY    ? CHOLECYSTECTOMY    ? EYE SURGERY    ? repair corneal tare  ? knee arthroscopy x 2    ? Left x 2; right x 1  ? WISDOM TOOTH EXTRACTION    ? ? ?Family History  ?Problem Relation Age of Onset  ? Cancer Mother   ?     skin  ? Cancer Father   ?     skin  ? Heart disease Father   ? ? ? ? ? ?Current Outpatient Medications:  ?  clonazePAM (KLONOPIN) 1 MG tablet, Take 1/2 to 1 tablet by mouth 2 times daily as needed for anxiety., Disp: 30 tablet, Rfl: 0 ?  ELDERBERRY PO, Take by mouth., Disp: , Rfl:  ?   escitalopram (LEXAPRO) 5 MG tablet, TAKE 1 TABLET BY MOUTH DAILY., Disp: 90 tablet, Rfl: 1 ?  montelukast (SINGULAIR) 10 MG tablet, TAKE 1 TABLET BY MOUTH AT BEDTIME, Disp: 90 tablet, Rfl: 3 ?  Omega-3 Fatty Acids (FISH OIL PO), Take by mouth., Disp: , Rfl:  ?  TART CHERRY PO, Take by mouth., Disp: , Rfl:  ?  TURMERIC PO, Take by mouth., Disp: , Rfl:  ?  traZODone (DESYREL) 100 MG tablet, Take 1 and 1/2 tablets (150 mg total) by mouth at bedtime., Disp: 135 tablet, Rfl: 1 ? ?EXAM: ? ?VITALS per patient if applicable: ? ?GENERAL: alert, oriented, appears well and in no acute distress ? ?HEENT: atraumatic, conjunttiva clear, no obvious abnormalities on inspection of external nose and ears ? ?NECK: normal movements of the head and neck ? ?LUNGS: on inspection no signs of respiratory distress, breathing rate appears normal, no obvious gross SOB, gasping or wheezing ? ?CV: no obvious cyanosis ? ?MS: moves all visible extremities without noticeable abnormality ? ?PSYCH/NEURO: pleasant and cooperative, no obvious  speech and thought processing grossly intact, crying spells during video conference.  ? ?ASSESSMENT AND PLAN: ? ?Discussed the following assessment and plan: ? ?Anxiety -  Plan: clonazePAM (KLONOPIN) 1 MG tablet ? ?Grief - Plan: clonazePAM (KLONOPIN) 1 MG tablet ? ?Will refer to local counselor for help in managing grief reaction as well as anxiety.  We will also provide short course of Klonopin that she can take nightly to help her sleep. ?  ?I discussed the assessment and treatment plan with the patient. The patient was provided an opportunity to ask questions and all were answered. The patient agreed with the plan and demonstrated an understanding of the instructions. ?  ?The patient was advised to call back or seek an in-person evaluation if the symptoms worsen or if the condition fails to improve as anticipated. ? ? ?Dorothyann Peng, NP  ? ?

## 2021-06-16 ENCOUNTER — Other Ambulatory Visit (HOSPITAL_COMMUNITY): Payer: Self-pay

## 2021-06-27 ENCOUNTER — Other Ambulatory Visit: Payer: Self-pay | Admitting: Adult Health

## 2021-06-27 ENCOUNTER — Other Ambulatory Visit (HOSPITAL_COMMUNITY): Payer: Self-pay

## 2021-06-27 MED ORDER — HYDROCODONE BIT-HOMATROP MBR 5-1.5 MG/5ML PO SOLN
5.0000 mL | Freq: Three times a day (TID) | ORAL | 0 refills | Status: DC | PRN
  Filled 2021-06-27: qty 120, 8d supply, fill #0

## 2021-06-27 MED ORDER — PREDNISONE 10 MG PO TABS
ORAL_TABLET | ORAL | 0 refills | Status: DC
  Filled 2021-06-27: qty 21, 9d supply, fill #0

## 2021-08-13 ENCOUNTER — Other Ambulatory Visit (HOSPITAL_COMMUNITY): Payer: Self-pay

## 2021-08-29 ENCOUNTER — Other Ambulatory Visit: Payer: Self-pay | Admitting: Adult Health

## 2021-08-29 ENCOUNTER — Other Ambulatory Visit (HOSPITAL_COMMUNITY): Payer: Self-pay

## 2021-08-29 MED ORDER — CLONAZEPAM 2 MG PO TABS
2.0000 mg | ORAL_TABLET | Freq: Every evening | ORAL | 2 refills | Status: DC | PRN
  Filled 2021-08-29: qty 30, 30d supply, fill #0

## 2021-11-14 DIAGNOSIS — D2271 Melanocytic nevi of right lower limb, including hip: Secondary | ICD-10-CM | POA: Diagnosis not present

## 2021-11-14 DIAGNOSIS — L814 Other melanin hyperpigmentation: Secondary | ICD-10-CM | POA: Diagnosis not present

## 2021-11-14 DIAGNOSIS — L821 Other seborrheic keratosis: Secondary | ICD-10-CM | POA: Diagnosis not present

## 2021-11-14 DIAGNOSIS — D2361 Other benign neoplasm of skin of right upper limb, including shoulder: Secondary | ICD-10-CM | POA: Diagnosis not present

## 2021-11-14 DIAGNOSIS — D225 Melanocytic nevi of trunk: Secondary | ICD-10-CM | POA: Diagnosis not present

## 2021-11-14 DIAGNOSIS — D1724 Benign lipomatous neoplasm of skin and subcutaneous tissue of left leg: Secondary | ICD-10-CM | POA: Diagnosis not present

## 2021-12-04 ENCOUNTER — Other Ambulatory Visit (HOSPITAL_COMMUNITY): Payer: Self-pay

## 2021-12-04 ENCOUNTER — Other Ambulatory Visit: Payer: Self-pay | Admitting: Adult Health

## 2021-12-04 DIAGNOSIS — G4700 Insomnia, unspecified: Secondary | ICD-10-CM

## 2021-12-04 MED ORDER — TRAZODONE HCL 100 MG PO TABS
100.0000 mg | ORAL_TABLET | Freq: Every day | ORAL | 1 refills | Status: DC
  Filled 2021-12-04: qty 90, 90d supply, fill #0
  Filled 2022-05-21: qty 90, 90d supply, fill #1

## 2021-12-31 ENCOUNTER — Other Ambulatory Visit (HOSPITAL_COMMUNITY): Payer: Self-pay

## 2021-12-31 DIAGNOSIS — D1724 Benign lipomatous neoplasm of skin and subcutaneous tissue of left leg: Secondary | ICD-10-CM | POA: Diagnosis not present

## 2021-12-31 DIAGNOSIS — R52 Pain, unspecified: Secondary | ICD-10-CM | POA: Diagnosis not present

## 2021-12-31 MED ORDER — MUPIROCIN 2 % EX OINT
TOPICAL_OINTMENT | CUTANEOUS | 0 refills | Status: DC
  Filled 2021-12-31: qty 22, 30d supply, fill #0

## 2022-02-04 NOTE — Progress Notes (Signed)
51 y.o. G2P2 Married Caucasian female here for annual exam.  Pt reported skin dryness everywhere and wants to discuss, especially vaginally.  Working on weight loss.   No menses since September.  No longer having hot flashes.   She found a tube of estradiol cream she had at home.  Checking to see if she can use it.   Hx fibroid and function cyst on her ovary in the past.   Father and other family members died this year.  Daughter engaged.  PCP:   Shirline Frees, NP  Patient's last menstrual period was 11/03/2021 (within months).     Period Pattern: (!) Irregular Menstrual Flow:  (varies and comes about every 4 months)     Sexually active: Yes.    The current method of family planning is perimenopausal.   Vasectomy.  Exercising: Yes.     Walks dog 10-15K steps Smoker:  no  Health Maintenance: Pap:  02/07/21 negative: HR HPV negative, 01/14/17 negative: HR HPV negative History of abnormal Pap:  no MMG:  03/14/21, Breast Density Category A, BI-RADS CATEGORY 1 Negative, Scheduled for Feb 2024 Colonoscopy:  n/a, scheduled in March 2024 BMD:   n/a  Result  n/a TDaP:  2022 Gardasil:   no HIV: 2019, neg Hep C: not done Screening Labs:  PCP Flu and Covid vaccines completed.  Shingrix:  completed #1   reports that she has never smoked. She has never used smokeless tobacco. She reports current alcohol use. She reports that she does not use drugs.  Past Medical History:  Diagnosis Date   Allergy    Anxiety    Asthma    Depression    Scoliosis    Shingles    Stevens-Johnson syndrome ([facility])    as a child     Past Surgical History:  Procedure Laterality Date   CESAREAN SECTION     CHOLECYSTECTOMY     CHOLECYSTECTOMY     EYE SURGERY     repair corneal tare   knee arthroscopy x 2     Left x 2; right x 1   WISDOM TOOTH EXTRACTION      Current Outpatient Medications  Medication Sig Dispense Refill   ELDERBERRY PO Take by mouth.     escitalopram (LEXAPRO) 5 MG tablet TAKE  1 TABLET BY MOUTH DAILY. 90 tablet 1   Omega-3 Fatty Acids (FISH OIL PO) Take by mouth.     predniSONE (DELTASONE) 10 MG tablet Take 4 tablets by mouth daily for 3 days, then 2 tabs daily for 3 days then 1 tab daily for 3 days. 21 tablet 0   TART CHERRY PO Take by mouth.     traZODone (DESYREL) 100 MG tablet Take 1 tablet (100 mg total) by mouth at bedtime. 90 tablet 1   TURMERIC PO Take by mouth.     clonazePAM (KLONOPIN) 2 MG tablet Take 1 tablet (2 mg total) by mouth at bedtime as needed for anxiety. (Patient not taking: Reported on 02/13/2022) 30 tablet 2   HYDROcodone bit-homatropine (HYCODAN) 5-1.5 MG/5ML syrup Take 5 mLs by mouth every 8 (eight) hours as needed for cough. (Patient not taking: Reported on 02/13/2022) 120 mL 0   montelukast (SINGULAIR) 10 MG tablet TAKE 1 TABLET BY MOUTH AT BEDTIME 90 tablet 3   mupirocin ointment (BACTROBAN) 2 % Apply  a small amount to skin twice a day. Apply a thin layer. (Patient not taking: Reported on 02/13/2022) 22 g 0   No current facility-administered medications  for this visit.    Family History  Problem Relation Age of Onset   Cancer Mother        skin   Cancer Father        skin   Heart disease Father     Review of Systems  All other systems reviewed and are negative.   Exam:   BP 126/80 (BP Location: Right Arm, Patient Position: Sitting, Cuff Size: Large)   Pulse 68   Ht 5\' 6"  (1.676 m)   Wt 224 lb (101.6 kg)   LMP 11/03/2021 (Within Months)   SpO2 97%   BMI 36.15 kg/m     General appearance: alert, cooperative and appears stated age Head: normocephalic, without obvious abnormality, atraumatic Neck: no adenopathy, supple, symmetrical, trachea midline and thyroid normal to inspection and palpation Lungs: clear to auscultation bilaterally Breasts: normal appearance, no masses or tenderness, No nipple retraction or dimpling, No nipple discharge or bleeding, No axillary adenopathy Heart: regular rate and rhythm Abdomen: soft,  non-tender; no masses, no organomegaly Extremities: extremities normal, atraumatic, no cyanosis or edema Skin: skin color, texture, turgor normal. No rashes or lesions Lymph nodes: cervical, supraclavicular, and axillary nodes normal. Neurologic: grossly normal  Pelvic: External genitalia:  no lesions              No abnormal inguinal nodes palpated.              Urethra:  normal appearing urethra with no masses, tenderness or lesions              Bartholins and Skenes: normal                 Vagina: normal appearing vagina with normal color and discharge, no lesions              Cervix: no lesions              Pap taken: no Bimanual Exam:  Uterus:  normal size, contour, position, consistency, mobility, non-tender              Adnexa: no mass, fullness, tenderness              Rectal exam: yes.  Confirms.              Anus:  normal sphincter tone, no lesions  Chaperone was present for exam:  yes.  Assessment:   Well woman visit with gynecologic exam. Menopausal symptoms.  Hx uterine fibroid and left simple ovarian cyst.  Vaginal atrophy.  Bereavement.   Plan: Mammogram screening discussed. Self breast awareness reviewed. Pap and HR HPV as above. Guidelines for Calcium, Vitamin D, regular exercise program including cardiovascular and weight bearing exercise. Will check FSH and estradiol level.  If not in menopause, course of Provera 5 mg x 5 days.  Ok for use of Estradiol cream 1/2 gm pv at hs for 2 weeks and then two to three times weekly.  I discussed potential effect on breast cancer. Support given for the loss of loved ones.  Follow up annually and prn.   After visit summary provided.

## 2022-02-11 ENCOUNTER — Other Ambulatory Visit: Payer: Self-pay | Admitting: Adult Health

## 2022-02-11 DIAGNOSIS — Z1231 Encounter for screening mammogram for malignant neoplasm of breast: Secondary | ICD-10-CM

## 2022-02-13 ENCOUNTER — Other Ambulatory Visit (HOSPITAL_COMMUNITY): Payer: Self-pay

## 2022-02-13 ENCOUNTER — Encounter: Payer: Self-pay | Admitting: Obstetrics and Gynecology

## 2022-02-13 ENCOUNTER — Ambulatory Visit (INDEPENDENT_AMBULATORY_CARE_PROVIDER_SITE_OTHER): Payer: 59 | Admitting: Obstetrics and Gynecology

## 2022-02-13 VITALS — BP 126/80 | HR 68 | Ht 66.0 in | Wt 224.0 lb

## 2022-02-13 DIAGNOSIS — Z01419 Encounter for gynecological examination (general) (routine) without abnormal findings: Secondary | ICD-10-CM | POA: Diagnosis not present

## 2022-02-13 DIAGNOSIS — N951 Menopausal and female climacteric states: Secondary | ICD-10-CM | POA: Diagnosis not present

## 2022-02-13 MED ORDER — ESTRADIOL 0.1 MG/GM VA CREA
TOPICAL_CREAM | VAGINAL | 2 refills | Status: DC
  Filled 2022-02-13: qty 42.5, 90d supply, fill #0
  Filled 2022-05-21 – 2022-10-14 (×2): qty 42.5, 90d supply, fill #1

## 2022-02-13 NOTE — Patient Instructions (Signed)

## 2022-02-14 LAB — FOLLICLE STIMULATING HORMONE: FSH: 29.4 m[IU]/mL

## 2022-02-14 LAB — ESTRADIOL: Estradiol: 70 pg/mL

## 2022-02-15 ENCOUNTER — Other Ambulatory Visit (HOSPITAL_COMMUNITY): Payer: Self-pay

## 2022-02-15 ENCOUNTER — Other Ambulatory Visit: Payer: Self-pay | Admitting: Obstetrics and Gynecology

## 2022-02-15 MED ORDER — MEDROXYPROGESTERONE ACETATE 5 MG PO TABS
5.0000 mg | ORAL_TABLET | Freq: Every day | ORAL | 1 refills | Status: DC
  Filled 2022-02-15: qty 10, 90d supply, fill #0

## 2022-04-09 ENCOUNTER — Ambulatory Visit
Admission: RE | Admit: 2022-04-09 | Discharge: 2022-04-09 | Disposition: A | Discharge status: Home / Self Care | Payer: 59 | Source: Ambulatory Visit | Attending: Adult Health | Admitting: Adult Health

## 2022-04-09 DIAGNOSIS — Z1231 Encounter for screening mammogram for malignant neoplasm of breast: Secondary | ICD-10-CM

## 2022-04-26 ENCOUNTER — Encounter: Payer: Self-pay | Admitting: Adult Health

## 2022-04-26 ENCOUNTER — Ambulatory Visit (INDEPENDENT_AMBULATORY_CARE_PROVIDER_SITE_OTHER): Payer: 59 | Admitting: Adult Health

## 2022-04-26 VITALS — BP 122/78 | HR 59 | Temp 97.9°F | Ht 66.0 in | Wt 224.0 lb

## 2022-04-26 DIAGNOSIS — M2559 Pain in other specified joint: Secondary | ICD-10-CM | POA: Diagnosis not present

## 2022-04-26 DIAGNOSIS — Z0000 Encounter for general adult medical examination without abnormal findings: Secondary | ICD-10-CM

## 2022-04-26 DIAGNOSIS — E7439 Other disorders of intestinal carbohydrate absorption: Secondary | ICD-10-CM

## 2022-04-26 DIAGNOSIS — G4700 Insomnia, unspecified: Secondary | ICD-10-CM | POA: Diagnosis not present

## 2022-04-26 DIAGNOSIS — R21 Rash and other nonspecific skin eruption: Secondary | ICD-10-CM

## 2022-04-26 DIAGNOSIS — Z8249 Family history of ischemic heart disease and other diseases of the circulatory system: Secondary | ICD-10-CM

## 2022-04-26 DIAGNOSIS — Z1159 Encounter for screening for other viral diseases: Secondary | ICD-10-CM | POA: Diagnosis not present

## 2022-04-26 DIAGNOSIS — J45909 Unspecified asthma, uncomplicated: Secondary | ICD-10-CM | POA: Diagnosis not present

## 2022-04-26 DIAGNOSIS — Z1211 Encounter for screening for malignant neoplasm of colon: Secondary | ICD-10-CM | POA: Diagnosis not present

## 2022-04-26 LAB — TSH: TSH: 1.49 u[IU]/mL (ref 0.35–5.50)

## 2022-04-26 LAB — COMPREHENSIVE METABOLIC PANEL
ALT: 23 U/L (ref 0–35)
AST: 22 U/L (ref 0–37)
Albumin: 4.6 g/dL (ref 3.5–5.2)
Alkaline Phosphatase: 66 U/L (ref 39–117)
BUN: 13 mg/dL (ref 6–23)
CO2: 24 mEq/L (ref 19–32)
Calcium: 9.4 mg/dL (ref 8.4–10.5)
Chloride: 104 mEq/L (ref 96–112)
Creatinine, Ser: 0.69 mg/dL (ref 0.40–1.20)
GFR: 100.22 mL/min (ref >60.00)
Glucose, Bld: 95 mg/dL (ref 70–99)
Potassium: 4 mEq/L (ref 3.5–5.1)
Sodium: 138 mEq/L (ref 135–145)
Total Bilirubin: 0.5 mg/dL (ref 0.2–1.2)
Total Protein: 7.4 g/dL (ref 6.0–8.3)

## 2022-04-26 LAB — CBC WITH DIFFERENTIAL/PLATELET
Basophils Absolute: 0 10*3/uL (ref 0.0–0.1)
Basophils Relative: 0.6 % (ref 0.0–3.0)
Eosinophils Absolute: 0.1 10*3/uL (ref 0.0–0.7)
Eosinophils Relative: 1.5 % (ref 0.0–5.0)
HCT: 40.8 % (ref 36.0–46.0)
Hemoglobin: 13.8 g/dL (ref 12.0–15.0)
Lymphocytes Relative: 36.1 % (ref 12.0–46.0)
Lymphs Abs: 2.2 10*3/uL (ref 0.7–4.0)
MCHC: 33.7 g/dL (ref 30.0–36.0)
MCV: 90.1 fl (ref 78.0–100.0)
Monocytes Absolute: 0.4 10*3/uL (ref 0.1–1.0)
Monocytes Relative: 7.3 % (ref 3.0–12.0)
Neutro Abs: 3.3 10*3/uL (ref 1.4–7.7)
Neutrophils Relative %: 54.5 % (ref 43.0–77.0)
Platelets: 474 10*3/uL — ABNORMAL HIGH (ref 150.0–400.0)
RBC: 4.53 Mil/uL (ref 3.87–5.11)
RDW: 13.7 % (ref 11.5–15.5)
WBC: 6 10*3/uL (ref 4.0–10.5)

## 2022-04-26 LAB — LIPID PANEL
Cholesterol: 114 mg/dL (ref 0–200)
HDL: 64.4 mg/dL (ref >39.00)
LDL Cholesterol: 35 mg/dL (ref 0–99)
NonHDL: 49.93
Total CHOL/HDL Ratio: 2
Triglycerides: 73 mg/dL (ref 0.0–149.0)
VLDL: 14.6 mg/dL (ref 0.0–40.0)

## 2022-04-26 LAB — SEDIMENTATION RATE: Sed Rate: 33 mm/hr — ABNORMAL HIGH (ref 0–30)

## 2022-04-26 LAB — C-REACTIVE PROTEIN: CRP: 1.5 mg/dL (ref 0.5–20.0)

## 2022-04-26 NOTE — Progress Notes (Signed)
Subjective:    Patient ID: Mia Thomas, female    DOB: 05/01/1970, 52 y.o.   MRN: [id number]  HPI Patient presents for yearly preventative medicine examination. She is a pleasant 52 year old female who  has a past medical history of Allergy, Anxiety, Asthma, Depression, Scoliosis, Shingles, and Stevens-Johnson syndrome ([place]).  Insomnia-chronic in nature.  She stopped taking Trazodone as she was not sleeping any better   Asthma-takes Singulair daily.  She does feel as though her symptoms are well controlled  Glucose Intolerance - has single A1c of 6.5 in 08/2019. She does eat healthy and stays active.   Menopausal symptoms - prescribed progesterone by her GYN   Family history of heart disease - she would like to have a CT Cardiac Scoring done  Joint pain/rash -she occasionally gets a red rash throughout different parts of her body and her joints will become red/swollen/hot.  She has been worked up for autoimmune/rheumatoid in the past.  Currently has a red rash on her right upper forehead.  She would like lab work done again today to rule out any autoimmune/rheumatoid/psoriatic disease. She has been seen by Rheumatology in the past.     All immunizations and health maintenance protocols were reviewed with the patient and needed orders were placed.  Appropriate screening laboratory values were ordered for the patient including screening of hyperlipidemia, renal function and hepatic function. If indicated by BPH, a PSA was ordered.  Medication reconciliation,  past medical history, social history, problem list and allergies were reviewed in detail with the patient  Goals were established with regard to weight loss, exercise, and  diet in compliance with medications. She does eat a vegan diet and stays active with exercise  Wt Readings from Last 3 Encounters:  04/26/22 224 lb (101.6 kg)  02/13/22 224 lb (101.6 kg)  06/06/21 224 lb (101.6 kg)   She is up to date on routine GYN care  and mammograms. She is due for colon cancer screening   Review of Systems  Constitutional: Negative.   HENT: Negative.    Eyes: Negative.   Respiratory: Negative.    Cardiovascular: Negative.   Gastrointestinal: Negative.   Endocrine: Negative.   Genitourinary: Negative.   Musculoskeletal:  Positive for arthralgias and joint swelling.  Skin:  Positive for rash.  Allergic/Immunologic: Negative.   Neurological: Negative.   Hematological: Negative.   Psychiatric/Behavioral:  Positive for sleep disturbance. The patient is nervous/anxious.    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   Originally from [place], and moved to [place] from [place] in 2016.   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 She is considering what else she will do with her time as her children are much less dependent on her.   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   Social Determinants of Health   Financial Resource Strain: Not on file  Food Insecurity: Not on file  Transportation Needs: Not on file  Physical Activity: Not on file  Stress: Not on file  Social Connections: Not on file  Intimate Partner Violence: Not on file    Past Surgical History:  Procedure Laterality Date   CESAREAN SECTION     CHOLECYSTECTOMY     CHOLECYSTECTOMY     EYE SURGERY     repair corneal tare   knee arthroscopy x 2     Left x 2; right x 1   WISDOM TOOTH EXTRACTION      Family History  Problem Relation Age of Onset   Cancer Mother        skin   Cancer Father        skin   Heart disease Father     Allergies  Allergen Reactions   Sulfa Antibiotics     Remo Lipps Johnson"s syndrome   Gramineae Pollens Other (See Comments)    Current Outpatient Medications on File Prior to Visit  Medication Sig Dispense Refill   Borage, Borago officinalis, (BORAGE OIL PO) Take by mouth.     ELDERBERRY PO Take by mouth.     estradiol (ESTRACE) 0.1 MG/GM vaginal cream Use 1/2 g vaginally every night for the first 2 weeks, then use 1/2 g vaginally two or three times per week as needed to maintain symptom relief. 42.5 g 2   medroxyPROGESTERone (PROVERA) 5 MG tablet Take 1 tablet by mouth daily for 5 days every 3 months. 10 tablet 1   montelukast (SINGULAIR) 10 MG tablet TAKE 1 TABLET BY MOUTH AT BEDTIME 90 tablet 3   Omega-3 Fatty Acids (FISH OIL PO) Take by mouth.     TART CHERRY PO Take by mouth.     traZODone (DESYREL) 100 MG tablet Take 1 tablet (100 mg total) by mouth at bedtime. 90 tablet 1   TURMERIC PO Take by mouth.     No current facility-administered medications on file prior to visit.    BP 122/78   Pulse (!) 59    Temp 97.9 F (36.6 C) (Oral)   Ht '5\' 6"'$  (1.676 m)   Wt 224 lb (101.6 kg)   SpO2 96%   BMI 36.15 kg/m       Objective:   Physical Exam Vitals and nursing note reviewed.  Constitutional:      General: She is not in acute distress.    Appearance: Normal appearance. She is well-developed. She is not ill-appearing.  HENT:     Head: Normocephalic and atraumatic.     Right Ear: Tympanic membrane, ear canal and external ear normal. There is  no impacted cerumen.     Left Ear: Tympanic membrane, ear canal and external ear normal. There is no impacted cerumen.     Nose: Nose normal. No congestion or rhinorrhea.     Mouth/Throat:     Mouth: Mucous membranes are moist.     Pharynx: Oropharynx is clear. No oropharyngeal exudate or posterior oropharyngeal erythema.  Eyes:     General:        Right eye: No discharge.        Left eye: No discharge.     Extraocular Movements: Extraocular movements intact.     Conjunctiva/sclera: Conjunctivae normal.     Pupils: Pupils are equal, round, and reactive to light.  Neck:     Thyroid: No thyromegaly.     Vascular: No carotid bruit.     Trachea: No tracheal deviation.  Cardiovascular:     Rate and Rhythm: Normal rate and regular rhythm.     Pulses: Normal pulses.     Heart sounds: Normal heart sounds. No murmur heard.    No friction rub. No gallop.  Pulmonary:     Effort: Pulmonary effort is normal. No respiratory distress.     Breath sounds: Normal breath sounds. No stridor. No wheezing, rhonchi or rales.  Chest:     Chest wall: No tenderness.  Abdominal:     General: Abdomen is flat. Bowel sounds are normal. There is no distension.     Palpations: Abdomen is soft. There is no mass.     Tenderness: There is no abdominal tenderness. There is no right CVA tenderness, left CVA tenderness, guarding or rebound.     Hernia: No hernia is present.  Musculoskeletal:        General: No swelling, tenderness, deformity or signs of injury. Normal range  of motion.     Cervical back: Normal range of motion and neck supple.     Right lower leg: No edema.     Left lower leg: No edema.     Comments: Nodules noted on multiple fingers   Lymphadenopathy:     Cervical: No cervical adenopathy.  Skin:    General: Skin is warm and dry.     Coloration: Skin is not jaundiced or pale.     Findings: No bruising, erythema, lesion or rash.     Comments: Red flat rash on upper right forehead   Neurological:     General: No focal deficit present.     Mental Status: She is alert and oriented to person, place, and time.     Cranial Nerves: No cranial nerve deficit.     Sensory: No sensory deficit.     Motor: No weakness.     Coordination: Coordination normal.     Gait: Gait normal.     Deep Tendon Reflexes: Reflexes normal.  Psychiatric:        Mood and Affect: Mood normal.        Behavior: Behavior normal.        Thought Content: Thought content normal.        Judgment: Judgment normal.       Assessment & Plan:  1. Routine general medical examination at a health care facility  - CBC with Differential/Platelet; Future - Comprehensive metabolic panel; Future - Lipid panel; Future - TSH; Future - Lipid panel - Comprehensive metabolic panel - CBC with Differential/Platelet - TSH  2. Glucose intolerance -  Consider metformin  - CBC with Differential/Platelet; Future - Comprehensive metabolic panel; Future -  Lipid panel; Future - TSH; Future - Lipid panel - Comprehensive metabolic panel - CBC with Differential/Platelet - TSH  3. Intrinsic asthma - Continue Singulair  - CBC with Differential/Platelet; Future - Comprehensive metabolic panel; Future - Lipid panel; Future - TSH; Future - Lipid panel - Comprehensive metabolic panel - CBC with Differential/Platelet - TSH  4. Insomnia, unspecified type - Consider  different agent  - CBC with Differential/Platelet; Future - Comprehensive metabolic panel; Future - Lipid panel;  Future - TSH; Future - Lipid panel - Comprehensive metabolic panel - CBC with Differential/Platelet - TSH  5. Colon cancer screening - Follow up for Colonoscopy this spring   6. Need for hepatitis C screening test  - Hep C Antibody; Future - Hep C Antibody  7. Family history of heart disease  - CT CARDIAC SCORING (SELF PAY ONLY); Future  8. Rash  - C-reactive Protein; Future - Rheumatoid Factor; Future - ANA; Future - ANA - Rheumatoid Factor - C-reactive Protein - Sedimentation Rate  9. Pain in other joint  - Sedimentation Rate; Future - C-reactive Protein; Future - Rheumatoid Factor; Future - ANA; Future - ANA - Rheumatoid Factor - C-reactive Protein - Sedimentation Rate  Dorothyann Peng, NP

## 2022-04-29 LAB — RHEUMATOID FACTOR: Rheumatoid fact SerPl-aCnc: <14 IU/mL (ref <14)

## 2022-04-29 LAB — ANA: Anti Nuclear Antibody (ANA): NEGATIVE

## 2022-04-29 LAB — HEPATITIS C ANTIBODY: Hepatitis C Ab: NONREACTIVE

## 2022-05-07 ENCOUNTER — Other Ambulatory Visit: Payer: Self-pay | Admitting: Adult Health

## 2022-05-07 ENCOUNTER — Other Ambulatory Visit (HOSPITAL_COMMUNITY): Payer: Self-pay

## 2022-05-07 MED ORDER — LORAZEPAM 2 MG PO TABS
2.0000 mg | ORAL_TABLET | Freq: Three times a day (TID) | ORAL | 1 refills | Status: DC | PRN
  Filled 2022-05-07: qty 30, 10d supply, fill #0
  Filled 2022-10-14: qty 30, 10d supply, fill #1

## 2022-05-21 ENCOUNTER — Other Ambulatory Visit: Payer: Self-pay | Admitting: Adult Health

## 2022-05-21 ENCOUNTER — Other Ambulatory Visit (HOSPITAL_COMMUNITY): Payer: Self-pay

## 2022-05-21 ENCOUNTER — Other Ambulatory Visit: Payer: Self-pay

## 2022-05-21 ENCOUNTER — Telehealth: Payer: Self-pay | Admitting: Adult Health

## 2022-05-21 MED ORDER — BELSOMRA 15 MG PO TABS
15.0000 mg | ORAL_TABLET | Freq: Every evening | ORAL | 0 refills | Status: DC | PRN
  Filled 2022-05-21: qty 10, 10d supply, fill #0

## 2022-05-21 MED ORDER — VENLAFAXINE HCL ER 37.5 MG PO CP24
37.5000 mg | ORAL_CAPSULE | Freq: Every day | ORAL | 0 refills | Status: DC
  Filled 2022-05-21: qty 30, 30d supply, fill #0

## 2022-05-21 NOTE — Telephone Encounter (Signed)
Spoke to patient and she informed me that she had to restart her Lexapro due to anxiety and sleep disturbance.  She was on trazodone for insomnia at 1 point in time but found this to no longer be working.  She has been on Lexapro for roughly 2 weeks.

## 2022-05-22 ENCOUNTER — Other Ambulatory Visit: Payer: Self-pay

## 2022-05-23 ENCOUNTER — Other Ambulatory Visit: Payer: Self-pay

## 2022-05-23 ENCOUNTER — Other Ambulatory Visit (HOSPITAL_COMMUNITY): Payer: Self-pay

## 2022-05-23 DIAGNOSIS — H52223 Regular astigmatism, bilateral: Secondary | ICD-10-CM | POA: Diagnosis not present

## 2022-05-28 ENCOUNTER — Ambulatory Visit: Payer: Commercial Managed Care - PPO | Admitting: Obstetrics and Gynecology

## 2022-05-29 ENCOUNTER — Other Ambulatory Visit (HOSPITAL_COMMUNITY): Payer: Self-pay

## 2022-05-29 ENCOUNTER — Encounter: Payer: Self-pay | Admitting: Obstetrics and Gynecology

## 2022-05-29 ENCOUNTER — Ambulatory Visit: Payer: 59 | Admitting: Obstetrics and Gynecology

## 2022-05-29 VITALS — BP 118/82 | HR 73 | Ht 65.75 in | Wt 223.0 lb

## 2022-05-29 DIAGNOSIS — G4700 Insomnia, unspecified: Secondary | ICD-10-CM

## 2022-05-29 DIAGNOSIS — N939 Abnormal uterine and vaginal bleeding, unspecified: Secondary | ICD-10-CM

## 2022-05-29 DIAGNOSIS — M2540 Effusion, unspecified joint: Secondary | ICD-10-CM | POA: Insufficient documentation

## 2022-05-29 DIAGNOSIS — N951 Menopausal and female climacteric states: Secondary | ICD-10-CM

## 2022-05-29 MED ORDER — NORETHIN-ETH ESTRAD-FE BIPHAS 1 MG-10 MCG / 10 MCG PO TABS
1.0000 | ORAL_TABLET | Freq: Every day | ORAL | 2 refills | Status: DC
  Filled 2022-05-29: qty 28, 28d supply, fill #0
  Filled 2022-07-22 (×2): qty 28, 28d supply, fill #1

## 2022-05-29 MED ORDER — TRAZODONE HCL 100 MG PO TABS
100.0000 mg | ORAL_TABLET | Freq: Every day | ORAL | 0 refills | Status: DC
  Filled 2022-05-29: qty 30, 30d supply, fill #0

## 2022-05-29 NOTE — Progress Notes (Unsigned)
GYNECOLOGY  VISIT   HPI: 52 y.o.   Married  Caucasian  female   G2P2 with No LMP recorded. Patient is perimenopausal.   here for irregular bleeding. 6 weeks ago, patient had a period for 9-10 days. Day 9-10, pt had a heavy amount of blood that did not seem like menstrual blood. Pt has been bleeding irregularly and having issues with sleeping/mood in 6 weeks.  Patient's husband, Dr. Loletha Carrow is present for a portion of the visit today.  ----  Patient is having irregular heavy vaginal bleeding. Bled for 2 weeks, which ended with heavy and excessive bleeding. The bleeding stopped and then restarted as ongoing bleeding for one month.  No current vaginal bleeding.  Has a history of a fundal uterine fibroid.   Having sweating and insomnia.   Received Rx for Effexor from her PCP, but has not started this medication.  Belsomra not working for insomnia. She tried for only one night.  Ambien has not worked to treat her insomnia in the past.  She has taken Trazodone 100 mg from a family member's prescription and found this helpful. She acknowledges that she develops a tolerance to medication.   Had Lexapro on hand and started 10 mg daily for the last 3 weeks to help treat her symptoms. She previously weaned off of this medication one year ago. This has not stopped her panic attacks.    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   Denies HTN, migraine with aura, she is a nonsmoker, denies history of liver or breast disease, thromboembolism of herself or others in her family.   GYNECOLOGIC HISTORY: No LMP recorded. Patient is perimenopausal. Contraception:  vasectomy Menopausal hormone therapy:  n/a Last mammogram:  04/09/22 Breast Density Category B, BI-RADS CAT 1 neg Last pap smear:   02/07/21 neg: HR HPV neg        OB History     Gravida  2   Para  2   Term      Preterm      AB      Living  2      SAB      IAB      Ectopic      Multiple      Live  Births           Obstetric Comments  +1 adopted            Patient Active Problem List   Diagnosis Date Noted   Joint swelling 05/29/2022   SI (sacroiliac) joint dysfunction 12/19/2020   Somatic dysfunction of spine, sacral 12/19/2020   Pain of left hip joint 02/07/2020   Panic anxiety syndrome 10/16/2016   Perimenopausal symptoms 10/16/2016   Depression 04/28/2016   Anxiety 04/28/2016   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   Allergic rhinitis 01/08/2016    Past Medical History:  Diagnosis Date   Allergy    Anxiety    Asthma    Depression    Scoliosis    Shingles    Stevens-Johnson syndrome ([place])    as a child     Past Surgical History:  Procedure Laterality Date   CESAREAN SECTION     CHOLECYSTECTOMY     CHOLECYSTECTOMY     EYE SURGERY     repair corneal tare   knee arthroscopy x 2     Left x 2; right x 1   WISDOM TOOTH EXTRACTION      Current Outpatient Medications  Medication  Sig Dispense Refill   Borage, Borago officinalis, (BORAGE OIL PO) Take by mouth.     ELDERBERRY PO Take by mouth.     escitalopram (LEXAPRO) 10 MG tablet Take by mouth.     estradiol (ESTRACE) 0.1 MG/GM vaginal cream Use 1/2 g vaginally every night for the first 2 weeks, then use 1/2 g vaginally two or three times per week as needed to maintain symptom relief. 42.5 g 2   LORazepam (ATIVAN) 2 MG tablet Take 1 tablet (2 mg total) by mouth every 8 (eight) hours as needed for anxiety. 30 tablet 1   Norethindrone-Ethinyl Estradiol-Fe Biphas (LO LOESTRIN FE) 1 MG-10 MCG / 10 MCG tablet Take 1 tablet by mouth daily. 28 tablet 2   Omega-3 Fatty Acids (FISH OIL PO) Take by mouth.     TART CHERRY PO Take by mouth.     traZODone (DESYREL) 100 MG tablet Take 1 tablet (100 mg total) by mouth at bedtime. Take as needed. 30 tablet 0   TURMERIC PO Take by mouth.     montelukast (SINGULAIR) 10 MG tablet TAKE 1 TABLET BY MOUTH AT BEDTIME 90 tablet 3   venlafaxine XR (EFFEXOR XR) 37.5 MG 24 hr capsule Take 1  capsule (37.5 mg total) by mouth daily with breakfast. (Patient not taking: Reported on 05/29/2022) 30 capsule 0   No current facility-administered medications for this visit.     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   Originally from [place], and moved to [place] from [place] in 2016.   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 She is considering what else she will do with her time as her children are much less dependent on her.   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   Social Determinants of Health   Financial Resource Strain: Not on file  Food Insecurity: Not on file   Transportation Needs: Not on file  Physical Activity: Not on file  Stress: Not on file  Social Connections: Not on  file  Intimate Partner Violence: Not on file    Review of Systems  All other systems reviewed and are negative.   PHYSICAL EXAMINATION:    BP 118/82 (BP Location: Left Arm, Patient Position: Sitting, Cuff Size: Large)   Pulse 73   Ht 5' 5.75" (1.67 m)   Wt 223 lb (101.2 kg)   SpO2 97%   BMI 36.27 kg/m     General appearance: alert, cooperative and appears stated age   Pelvic: External genitalia:  no lesions              Urethra:  normal appearing urethra with no masses, tenderness or lesions              Bartholins and Skenes: normal                 Vagina: normal appearing vagina with normal color and discharge, no lesions              Cervix: no lesions                Bimanual Exam:  Uterus:  normal size, contour, position, consistency, mobility, non-tender              Adnexa: no mass, fullness, tenderness          Chaperone was present for exam:  Terra  ASSESSMENT  Abnormal uterine bleeding.  Perimenopausal symptoms.  Hx fibroid.  Insomnia.  Hx depression/anxiety/panic.   PLAN  Will start LoLoestrin birth control pills to treat abnormal uterine bleeding and menopausal vasomotor symptoms.  Warning signs discussed.  Risk of stroke, DVT, PE, MI reviewed. Will do a pill recheck in about 3 months. Return for pelvic US.  Rx given for Trazodone 100 mg, take one tablet by mouth nightly for insomnia.  #30, no refills.  She will continue with her Lexapro 10 mg, which is currently has on hand.  She will see her PCP for medication monitoring for depression/anxiety/panic/insomnia.  I did give the patient a brochure on menopause.   30 min  total time was spent for this patient encounter, including preparation, face-to-face counseling with the patient, coordination of care, and documentation of the encounter.

## 2022-06-06 ENCOUNTER — Ambulatory Visit (HOSPITAL_BASED_OUTPATIENT_CLINIC_OR_DEPARTMENT_OTHER)
Admission: RE | Admit: 2022-06-06 | Discharge: 2022-06-06 | Disposition: A | Discharge status: Home / Self Care | Payer: 59 | Source: Ambulatory Visit | Attending: Adult Health | Admitting: Adult Health

## 2022-06-06 DIAGNOSIS — Z8249 Family history of ischemic heart disease and other diseases of the circulatory system: Secondary | ICD-10-CM | POA: Insufficient documentation

## 2022-06-12 ENCOUNTER — Ambulatory Visit (INDEPENDENT_AMBULATORY_CARE_PROVIDER_SITE_OTHER): Payer: 59

## 2022-06-12 ENCOUNTER — Other Ambulatory Visit: Payer: Self-pay | Admitting: Obstetrics and Gynecology

## 2022-06-12 DIAGNOSIS — N939 Abnormal uterine and vaginal bleeding, unspecified: Secondary | ICD-10-CM | POA: Diagnosis not present

## 2022-06-13 ENCOUNTER — Ambulatory Visit (INDEPENDENT_AMBULATORY_CARE_PROVIDER_SITE_OTHER): Payer: 59 | Admitting: Obstetrics and Gynecology

## 2022-06-13 ENCOUNTER — Encounter: Payer: Self-pay | Admitting: Obstetrics and Gynecology

## 2022-06-13 VITALS — BP 124/82 | HR 77 | Ht 65.75 in | Wt 223.0 lb

## 2022-06-13 DIAGNOSIS — N970 Female infertility associated with anovulation: Secondary | ICD-10-CM

## 2022-06-13 DIAGNOSIS — D219 Benign neoplasm of connective and other soft tissue, unspecified: Secondary | ICD-10-CM | POA: Diagnosis not present

## 2022-06-13 NOTE — Progress Notes (Signed)
GYNECOLOGY  VISIT   HPI: 52 y.o.   Married  Caucasian  female   G2P2 with No LMP recorded. Patient is perimenopausal.   here for  pelvic U/S consult for episode of menorrhagia followed by irregular vaginal bleeding.  She was started on LoLoestrin.  COCs are going well.  Mild headache but not needing to take anything for it.  Feels back to normal since starting the birth control pills.  Bleeding stopped.  Sleeping 6.5 - 7 hours per night. Taking Trazodone, cherry extract and magnesium.   Muscle pain has also improved since starting birth control.  Taking Lexapro in the am.  Not taking Ativan.   Coronary calcium 0.  GYNECOLOGIC HISTORY: No LMP recorded. Patient is perimenopausal. Contraception:  vasectomy Menopausal hormone therapy:  n/a Last mammogram:  04/09/22 Breast Density Cat B, BI-RADS CAT 1 neg Last pap smear:   02/07/21 neg: HR HPV neg        OB History     Gravida  2   Para  2   Term      Preterm      AB      Living  2      SAB      IAB      Ectopic      Multiple      Live Births           Obstetric Comments  +1 adopted            Patient Active Problem List   Diagnosis Date Noted   Joint swelling 05/29/2022   SI (sacroiliac) joint dysfunction 12/19/2020   Somatic dysfunction of spine, sacral 12/19/2020   Pain of left hip joint 02/07/2020   Panic anxiety syndrome 10/16/2016   Perimenopausal symptoms 10/16/2016   Depression 04/28/2016   Anxiety 04/28/2016   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     ELDERBERRY PO Take by mouth.     escitalopram (LEXAPRO) 10 MG tablet Take by mouth.     estradiol (ESTRACE) 0.1 MG/GM vaginal cream Use 1/2 g vaginally every night for the first 2 weeks, then use 1/2 g vaginally two or three times per week as needed to maintain symptom relief. 42.5 g 2   LORazepam (ATIVAN) 2 MG tablet Take 1 tablet (2 mg total) by mouth every 8 (eight) hours as needed for anxiety. 30 tablet 1   Norethindrone-Ethinyl Estradiol-Fe Biphas (LO LOESTRIN FE) 1 MG-10 MCG / 10 MCG tablet Take 1 tablet by mouth daily. 28 tablet 2   Omega-3 Fatty Acids (FISH OIL PO) Take by mouth.     TART CHERRY PO Take by mouth.     traZODone (DESYREL) 100 MG tablet Take 1 tablet (100 mg total) by  mouth at bedtime. Take as needed. 30 tablet 0   TURMERIC PO Take by mouth.     venlafaxine XR (EFFEXOR XR) 37.5 MG 24 hr capsule Take 1 capsule (37.5 mg total) by mouth daily with breakfast. 30 capsule 0   montelukast (SINGULAIR) 10 MG tablet TAKE 1 TABLET BY MOUTH AT BEDTIME 90 tablet 3   No current facility-administered medications for this visit.     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   Originally from [place], and moved to [place] from [place] in 2016.   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 She is considering what else she will do with her time as her children are much less dependent on her.   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   Social Determinants of Health   Financial Resource Strain: Not on file  Food Insecurity: Not on file  Transportation Needs: Not on file  Physical Activity: Not on file  Stress: Not on file  Social Connections: Not on file  Intimate Partner Violence: Not on file    Review of Systems  All other systems reviewed and are negative.   PHYSICAL EXAMINATION:    BP 124/82 (BP Location: Right Arm, Patient Position: Sitting, Cuff Size: Large)   Pulse 77   Ht 5' 5.75" (1.67 m)   Wt 223 lb (101.2 kg)   SpO2 97%   BMI 36.27 kg/m     General appearance: alert, cooperative and appears stated age   Pelvic US Uterus 8.59 x 4.78 x 4.57 cm.  1 fibroid: 3.14 cm, fundal and subserosal. EMS 4.39 mm. No masses.  Left ovary 2.03 x 1.34 x 1.62 cm. Right ovary 2.234 x 1.57 x 1.55 cm.  No adnexal masses.  No free fluid.   ASSESSMENT  Uterine fibroid.  Menorrhagia with irregular menses.  I suspect anovulatory bleeding.  Resolved with LoLoestrin.  Perimenopause.  PLAN  Pelvic US images and report reviewed.  Reassurance  regarding stability of uterine fibroid.  Continue  LoLoestrin.  Follow up for a recheck in about 6 weeks, before her current prescription runs out.    20 min  total time was spent for this patient encounter, including preparation, face-to-face counseling with the patient, coordination of care, and documentation of the encounter.

## 2022-06-26 ENCOUNTER — Other Ambulatory Visit: Payer: 59

## 2022-06-26 ENCOUNTER — Other Ambulatory Visit: Payer: 59 | Admitting: Obstetrics and Gynecology

## 2022-07-09 NOTE — Progress Notes (Signed)
GYNECOLOGY  VISIT   HPI: 52 y.o.   Married  Caucasian  female   G2P2 with No LMP recorded. Patient is perimenopausal.   here for   6 week f/u  Feels good on her birth control pills.  Sleeping well.  Not taking any medications for sleep.  No bleeding at all since starting birth control.  Having some headaches.  Tylenol takes HA away.   She is motivated to continue her current treatment.   She can check her blood pressure with her mother's BP monitoring device.   Daughter is marrying at the end of December, 2024.  GYNECOLOGIC HISTORY: No LMP recorded. Patient is perimenopausal. Contraception:  vasectomy Menopausal hormone therapy:  n/a Last mammogram:  04/09/22 Breast Density Cat B, BI-RADS CAT 1 neg  Last pap smear:   02/07/21 neg: HR HPV neg         OB History     Gravida  2   Para  2   Term      Preterm      AB      Living  2      SAB      IAB      Ectopic      Multiple      Live Births           Obstetric Comments  +1 adopted            Patient Active Problem List   Diagnosis Date Noted   Joint swelling 05/29/2022   SI (sacroiliac) joint dysfunction 12/19/2020   Somatic dysfunction of spine, sacral 12/19/2020   Pain of left hip joint 02/07/2020   Panic anxiety syndrome 10/16/2016   Perimenopausal symptoms 10/16/2016   Depression 04/28/2016   Anxiety 04/28/2016   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     ELDERBERRY PO Take by mouth.     escitalopram (LEXAPRO) 10 MG tablet Take by mouth.     estradiol (ESTRACE) 0.1 MG/GM vaginal cream Use 1/2 g vaginally every night for the first 2 weeks, then use 1/2 g vaginally two or three times per week as needed to maintain symptom relief. 42.5 g 2   LORazepam (ATIVAN) 2 MG tablet Take 1 tablet (2 mg total) by mouth every 8 (eight) hours as needed for anxiety. 30 tablet 1   Omega-3 Fatty Acids (FISH OIL PO) Take by mouth.     TART CHERRY PO Take by mouth.     traZODone (DESYREL) 100 MG tablet Take 1 tablet (100 mg total) by mouth at bedtime. Take as needed. 30 tablet 0   TURMERIC PO Take by mouth.     montelukast (SINGULAIR) 10 MG tablet TAKE 1 TABLET BY MOUTH AT BEDTIME 90 tablet  3   Norethindrone-Ethinyl Estradiol-Fe Biphas (LO LOESTRIN FE) 1 MG-10 MCG / 10 MCG tablet Take 1 tablet by mouth daily. 84 tablet 2   No current facility-administered medications for this visit.     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   Originally from [place], and moved to [place] from [place] in 2016.   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 She is considering what else she will do with her time as her children are much less dependent on her.   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   Social Determinants of Health   Financial Resource Strain: Not on file  Food Insecurity: Not on file  Transportation Needs: Not on file  Physical Activity: Not on file  Stress: Not on file  Social Connections: Not on file  Intimate Partner Violence: Not on file    Review of Systems  All other systems reviewed and are negative.   PHYSICAL EXAMINATION:    BP 120/72 (BP Location: Right Arm, Patient Position: Sitting, Cuff Size: Normal)   Pulse 68   Ht 5' 5.75" (1.67 m)   Wt 223 lb (101.2 kg)   SpO2 99%   BMI 36.27 kg/m     General appearance: alert, cooperative and appears stated age  ASSESSMENT  Perimenopausal symptoms.  Encounter for medication monitoring.  On combined oral contraception.  Some headache. BP normal with office exams post initiation of birth control.   PLAN  Patient will continue with low dose combined oral contraception.  She will return if headaches persist.  Next office visit in January, 2025 for annual exam.   22 min total time was spent for this patient encounter, including preparation, face-to-face counseling with the patient, coordination of care, and documentation of the encounter.

## 2022-07-22 ENCOUNTER — Other Ambulatory Visit (HOSPITAL_COMMUNITY): Payer: Self-pay

## 2022-07-23 ENCOUNTER — Other Ambulatory Visit: Payer: Self-pay

## 2022-07-23 ENCOUNTER — Encounter: Payer: Self-pay | Admitting: Obstetrics and Gynecology

## 2022-07-23 ENCOUNTER — Ambulatory Visit: Payer: 59 | Admitting: Obstetrics and Gynecology

## 2022-07-23 ENCOUNTER — Other Ambulatory Visit (HOSPITAL_COMMUNITY): Payer: Self-pay

## 2022-07-23 VITALS — BP 120/72 | HR 68 | Ht 65.75 in | Wt 223.0 lb

## 2022-07-23 DIAGNOSIS — Z5181 Encounter for therapeutic drug level monitoring: Secondary | ICD-10-CM | POA: Diagnosis not present

## 2022-07-23 DIAGNOSIS — N951 Menopausal and female climacteric states: Secondary | ICD-10-CM | POA: Diagnosis not present

## 2022-07-23 MED ORDER — NORETHIN-ETH ESTRAD-FE BIPHAS 1 MG-10 MCG / 10 MCG PO TABS
1.0000 | ORAL_TABLET | Freq: Every day | ORAL | 2 refills | Status: DC
  Filled 2022-07-23: qty 84, 84d supply, fill #0
  Filled 2022-10-14: qty 84, 84d supply, fill #1
  Filled 2023-01-02: qty 84, 84d supply, fill #2

## 2022-07-26 ENCOUNTER — Other Ambulatory Visit (HOSPITAL_COMMUNITY): Payer: Self-pay

## 2022-08-27 ENCOUNTER — Encounter: Payer: 59 | Admitting: Gastroenterology

## 2022-09-11 ENCOUNTER — Encounter: Payer: Self-pay | Admitting: Adult Health

## 2022-10-08 ENCOUNTER — Other Ambulatory Visit (HOSPITAL_COMMUNITY): Payer: Self-pay

## 2022-10-08 ENCOUNTER — Other Ambulatory Visit: Payer: Self-pay | Admitting: Adult Health

## 2022-10-08 MED ORDER — PROCHLORPERAZINE 25 MG RE SUPP
25.0000 mg | Freq: Two times a day (BID) | RECTAL | 1 refills | Status: DC | PRN
  Filled 2022-10-08: qty 12, 6d supply, fill #0

## 2022-10-09 ENCOUNTER — Other Ambulatory Visit (HOSPITAL_COMMUNITY): Payer: Self-pay

## 2022-10-09 ENCOUNTER — Ambulatory Visit: Payer: 59 | Admitting: Physician Assistant

## 2022-10-14 ENCOUNTER — Encounter: Payer: Self-pay | Admitting: Adult Health

## 2022-10-14 ENCOUNTER — Other Ambulatory Visit (HOSPITAL_COMMUNITY): Payer: Self-pay

## 2022-10-14 ENCOUNTER — Other Ambulatory Visit: Payer: Self-pay | Admitting: Adult Health

## 2022-10-14 ENCOUNTER — Other Ambulatory Visit: Payer: Self-pay

## 2022-10-15 ENCOUNTER — Other Ambulatory Visit: Payer: Self-pay

## 2022-10-15 ENCOUNTER — Other Ambulatory Visit (HOSPITAL_COMMUNITY): Payer: Self-pay

## 2022-10-15 MED ORDER — LORAZEPAM 2 MG PO TABS: 2.0000 mg | ORAL_TABLET | Freq: Three times a day (TID) | ORAL | 1 refills | Status: DC | PRN

## 2022-10-15 MED ORDER — TRAZODONE HCL 100 MG PO TABS
100.0000 mg | ORAL_TABLET | Freq: Every day | ORAL | 0 refills | Status: DC
  Filled 2022-10-15: qty 30, 30d supply, fill #0

## 2022-10-16 ENCOUNTER — Encounter: Payer: Self-pay | Admitting: Gastroenterology

## 2022-10-16 ENCOUNTER — Telehealth: Payer: Self-pay | Admitting: *Deleted

## 2022-10-16 DIAGNOSIS — Z1211 Encounter for screening for malignant neoplasm of colon: Secondary | ICD-10-CM

## 2022-10-16 NOTE — Telephone Encounter (Signed)
Plenvu instructions sent to pt via MyChart.  She already has Plenvu sample.

## 2022-10-28 ENCOUNTER — Encounter: Payer: Self-pay | Admitting: Gastroenterology

## 2022-10-28 ENCOUNTER — Ambulatory Visit (AMBULATORY_SURGERY_CENTER): Payer: 59 | Admitting: Gastroenterology

## 2022-10-28 VITALS — BP 112/71 | HR 63 | Temp 96.8°F | Resp 16 | Ht 65.0 in | Wt 223.0 lb

## 2022-10-28 DIAGNOSIS — Z1211 Encounter for screening for malignant neoplasm of colon: Secondary | ICD-10-CM

## 2022-10-28 DIAGNOSIS — F32A Depression, unspecified: Secondary | ICD-10-CM | POA: Diagnosis not present

## 2022-10-28 DIAGNOSIS — F419 Anxiety disorder, unspecified: Secondary | ICD-10-CM | POA: Diagnosis not present

## 2022-10-28 DIAGNOSIS — J45909 Unspecified asthma, uncomplicated: Secondary | ICD-10-CM | POA: Diagnosis not present

## 2022-10-28 MED ORDER — SODIUM CHLORIDE 0.9 % IV SOLN: 500.0000 mL | Freq: Once | INTRAVENOUS | Status: DC

## 2022-10-28 NOTE — Progress Notes (Signed)
To pacu, VSS. Report to [email or web address] 

## 2022-10-28 NOTE — Patient Instructions (Signed)

## 2022-10-28 NOTE — Op Note (Signed)
[facility] Patient Name: Mia Thomas Procedure Date: 10/28/2022 1:13 PM MRN: [id number] Endoscopist: Lynann Bologna , MD, [id number] Age: 52 Referring MD:  Date of Birth: [birth year] Gender: Female Account #: [id number] Procedure:                Colonoscopy Indications:              Screening for colorectal malignant neoplasm Medicines:                Monitored Anesthesia Care Procedure:                Pre-Anesthesia Assessment:                           - Prior to the procedure, a History and Physical                            was performed, and patient medications and                            allergies were reviewed. The patient's tolerance of                            previous anesthesia was also reviewed. The risks                            and benefits of the procedure and the sedation                            options and risks were discussed with the patient.                            All questions were answered, and informed consent                            was obtained. Prior Anticoagulants: The patient has                            taken no anticoagulant or antiplatelet agents. ASA                            Grade Assessment: II - A patient with mild systemic                            disease. After reviewing the risks and benefits,                            the patient was deemed in satisfactory condition to                            undergo the procedure.                           After obtaining informed consent, the colonoscope  was passed under direct vision. Throughout the                            procedure, the patient's blood pressure, pulse, and                            oxygen saturations were monitored continuously. The                            CF [id number] #[id number] was introduced through the anus                            and advanced to the 4 cm into the ileum. The                            colonoscopy was  performed without difficulty. The                            patient tolerated the procedure well. The quality                            of the bowel preparation was excellent. The                            terminal ileum, ileocecal valve, appendiceal                            orifice, and rectum were photographed. Scope In: 1:19:54 PM Scope Out: 1:34:14 PM Scope Withdrawal Time: 0 hours 10 minutes 47 seconds  Total Procedure Duration: 0 hours 14 minutes 20 seconds  Findings:                 The colon (entire examined portion) appeared normal.                           The terminal ileum appeared normal.                           Non-bleeding internal hemorrhoids were found during                            retroflexion. The hemorrhoids were minimal and                            Grade I (internal hemorrhoids that do not prolapse).                           The exam was otherwise without abnormality on                            direct and retroflexion views. Complications:            No immediate complications. Estimated Blood Loss:     Estimated blood loss: none. Impression:               -  Minimal internal hemorrhoids.                           - Otherwise normal colonoscopy to TI.                           - No specimens collected. Recommendation:           - Patient has a contact number available for                            emergencies. The signs and symptoms of potential                            delayed complications were discussed with the                            patient. Return to normal activities tomorrow.                            Written discharge instructions were provided to the                            patient.                           - Resume previous diet.                           - Continue present medications.                           - Repeat colonoscopy in 10 years for screening                            purposes. Earlier, if with any new problems  or                            change in family history.                           - The findings and recommendations were discussed                            with Dr Myrtie Neither. Lynann Bologna, MD 10/28/2022 1:39:01 PM This report has been signed electronically.

## 2022-10-28 NOTE — Progress Notes (Signed)
[facility] History and Physical   Primary Care Physician:  Shirline Frees, NP   Reason for Procedure:    CRC screening  Plan:     colonoscopy     HPI: Mia Thomas is a 52 y.o. female    Past Medical History:  Diagnosis Date   Allergy    Anxiety    Asthma    Depression    Scoliosis    Shingles    Stevens-Johnson syndrome (HCC)    as a child     Past Surgical History:  Procedure Laterality Date   CESAREAN SECTION     CHOLECYSTECTOMY     CHOLECYSTECTOMY     EYE SURGERY     repair corneal tare   knee arthroscopy x 2     Left x 2; right x 1   WISDOM TOOTH EXTRACTION      Prior to Admission medications   Medication Sig Start Date End Date Taking? Authorizing Provider  ELDERBERRY PO Take by mouth.   Yes [provider]  Norethindrone-Ethinyl Estradiol-Fe Biphas (LO LOESTRIN FE) 1 MG-10 MCG / 10 MCG tablet Take 1 tablet by mouth daily. 07/23/22  Yes Amundson Shirley Friar, MD  Omega-3 Fatty Acids (FISH OIL PO) Take by mouth.   Yes [provider]  traZODone (DESYREL) 100 MG tablet Take 1 tablet (100 mg total) by mouth at bedtime. Take as needed. 10/15/22  Yes Nafziger, Kandee Keen, NP  TURMERIC PO Take by mouth.   Yes [provider]  LORazepam (ATIVAN) 2 MG tablet Take 1 tablet (2 mg total) by mouth every 8 (eight) hours as needed for anxiety. 10/15/22   Nafziger, Kandee Keen, NP  montelukast (SINGULAIR) 10 MG tablet TAKE 1 TABLET BY MOUTH AT BEDTIME 09/01/20 04/26/22  Nafziger, Kandee Keen, NP  prochlorperazine (COMPAZINE) 25 MG suppository Place 1 suppository rectally every 12 hours as needed for nausea or vomiting. Patient not taking: Reported on 10/28/2022 10/08/22   Shirline Frees, NP    Current Outpatient Medications  Medication Sig Dispense Refill   ELDERBERRY PO Take by mouth.     Norethindrone-Ethinyl Estradiol-Fe Biphas (LO LOESTRIN FE) 1 MG-10 MCG / 10 MCG tablet Take 1 tablet by mouth daily. 84 tablet 2   Omega-3 Fatty Acids (FISH OIL PO)  Take by mouth.     traZODone (DESYREL) 100 MG tablet Take 1 tablet (100 mg total) by mouth at bedtime. Take as needed. 30 tablet 0   TURMERIC PO Take by mouth.     LORazepam (ATIVAN) 2 MG tablet Take 1 tablet (2 mg total) by mouth every 8 (eight) hours as needed for anxiety. 30 tablet 1   montelukast (SINGULAIR) 10 MG tablet TAKE 1 TABLET BY MOUTH AT BEDTIME 90 tablet 3   prochlorperazine (COMPAZINE) 25 MG suppository Place 1 suppository rectally every 12 hours as needed for nausea or vomiting. (Patient not taking: Reported on 10/28/2022) 12 suppository 1   Current Facility-Administered Medications  Medication Dose Route Frequency Provider Last Rate Last Admin   0.9 %  sodium chloride infusion  500 mL Intravenous Once Lynann Bologna, MD        Allergies as of 10/28/2022 - Review Complete 10/28/2022  Allergen Reaction Noted   Sulfa antibiotics  04/06/2015   Gramineae pollens Other (See Comments) 04/26/2022    Family History  Problem Relation Age of Onset   Cancer Mother        skin   Cancer Father        skin  Heart disease Father    Colon cancer Neg Hx    Esophageal cancer Neg Hx    Rectal cancer Neg Hx    Stomach cancer Neg Hx     Social History   Socioeconomic History   Marital status: Married    Spouse name: Nicoll Tonche   Number of children: 3   Years of education: Not on file   Highest education level: Not on file  Occupational History   Occupation: Education officer, 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   Originally from [place], and moved to [place] from [place] in 2016.   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 She is considering what else she will do with her time as her children are much less dependent on her.   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   Social Determinants of Health   Financial Resource Strain: Not on file  Food Insecurity: Not on file  Transportation Needs: Not on file  Physical Activity: Not on file  Stress: Not on file  Social Connections: Not on file  Intimate Partner Violence: Not on file    Review of Systems: Positive for none All other review of systems negative except as mentioned in the HPI.  Physical Exam: Vital signs in last 24 hours: @VSRANGES @   General:   Alert,  Well-developed, well-nourished, pleasant and cooperative in NAD Lungs:  Clear throughout to auscultation.   Heart:  Regular rate and rhythm; no murmurs, clicks, rubs,  or gallops. Abdomen:  Soft, nontender and nondistended. Normal bowel sounds.   Neuro/Psych:  Alert and cooperative. Normal mood and affect. A and O x 3    No significant changes were identified.  The patient continues to be an appropriate candidate for the planned procedure and anesthesia.   Edman Circle, MD. [facility] 10/28/2022 1:09 PM@

## 2022-10-29 ENCOUNTER — Telehealth: Payer: Self-pay | Admitting: *Deleted

## 2022-10-29 NOTE — Telephone Encounter (Signed)
  Follow up Call-     10/28/2022   12:53 PM  Call back number  Post procedure Call Back phone  # [phone]  Permission to leave phone message Yes     Patient questions:  Do you have a fever, pain , or abdominal swelling? No. Pain Score  0 *  Have you tolerated food without any problems? Yes.    Have you been able to return to your normal activities? Yes.    Do you have any questions about your discharge instructions: Diet   No. Medications  No. Follow up visit  No.  Do you have questions or concerns about your Care? No.  Actions: * If pain score is 4 or above: No action needed, pain <4.

## 2022-11-07 ENCOUNTER — Other Ambulatory Visit (HOSPITAL_COMMUNITY): Payer: Self-pay

## 2022-11-08 ENCOUNTER — Other Ambulatory Visit (HOSPITAL_COMMUNITY): Payer: Self-pay

## 2022-11-12 ENCOUNTER — Other Ambulatory Visit (HOSPITAL_COMMUNITY): Payer: Self-pay

## 2022-11-12 ENCOUNTER — Encounter: Payer: Self-pay | Admitting: Adult Health

## 2022-11-12 ENCOUNTER — Ambulatory Visit: Payer: 59 | Admitting: Adult Health

## 2022-11-12 VITALS — BP 110/60 | HR 63 | Temp 98.1°F | Ht 65.0 in | Wt 219.0 lb

## 2022-11-12 DIAGNOSIS — M2550 Pain in unspecified joint: Secondary | ICD-10-CM

## 2022-11-12 DIAGNOSIS — J45909 Unspecified asthma, uncomplicated: Secondary | ICD-10-CM | POA: Diagnosis not present

## 2022-11-12 DIAGNOSIS — G4700 Insomnia, unspecified: Secondary | ICD-10-CM

## 2022-11-12 MED ORDER — TRAZODONE HCL 100 MG PO TABS
100.0000 mg | ORAL_TABLET | Freq: Every day | ORAL | 3 refills | Status: DC
Start: 2022-11-12 — End: 2023-11-17
  Filled 2022-11-12: qty 90, 90d supply, fill #0
  Filled 2023-02-07: qty 90, 90d supply, fill #1
  Filled 2023-05-09: qty 90, 90d supply, fill #2
  Filled 2023-08-08: qty 90, 90d supply, fill #3

## 2022-11-12 MED ORDER — MELOXICAM 15 MG PO TABS
15.0000 mg | ORAL_TABLET | Freq: Every day | ORAL | 0 refills | Status: DC
Start: 2022-11-12 — End: 2022-12-23
  Filled 2022-11-12: qty 90, 90d supply, fill #0

## 2022-11-12 MED ORDER — MONTELUKAST SODIUM 10 MG PO TABS
ORAL_TABLET | Freq: Every day | ORAL | 3 refills | Status: DC
  Filled 2022-11-12: qty 90, 90d supply, fill #0
  Filled 2023-02-06: qty 90, 90d supply, fill #1
  Filled 2023-05-09: qty 90, 90d supply, fill #2
  Filled 2023-08-08: qty 90, 90d supply, fill #3

## 2022-11-12 NOTE — Progress Notes (Signed)
Subjective:    Patient ID: Mia Thomas, female    DOB: Dec 21, 1970, 52 y.o.   MRN: [id number]  HPI 52 year old female who  has a past medical history of Allergy, Anxiety, Asthma, Depression, Scoliosis, Shingles, and Stevens-Johnson syndrome (HCC).  She presents to the office today for multiple issues.    She would like to check her A1c. She has a history of elevated blood sugar levels. She does eat a healthy diet and stays active   Lab Results  Component Value Date   HGBA1C 5.3 11/23/2019   Wt Readings from Last 3 Encounters:  11/12/22 219 lb (99.3 kg)  10/28/22 223 lb (101.2 kg)  07/23/22 223 lb (101.2 kg)   Furthermore, she is having worsening stiffness and swelling especially in her hands and feet when she wakes up.  She will do range of motion exercises before she gets out of bed and this will help with the stiffness and swelling.  She is also having worse joint pain in her ankles, knees, and hips and has been noticed that her left hip seems to be higher than her right hip.  At times she will get a red rash throughout different parts of her body, has been worked up for psoriatic arthritis in the past with negative results.  She will take anti-inflammatories which help with her symptoms.  She has known lower back pain with a degree of scoliosis.  She also needs refills of Trazodone she uses for insomnia and singuliar she uses for intrinsic asthma.   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   Originally from [place], and moved to [place] from [place] in 2016.   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 She is considering what else she will do with her time as her children are much less dependent on her.   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   Social Determinants of Health   Financial Resource Strain: Not on file  Food  Insecurity: Not on file  Transportation Needs: Not on file  Physical Activity: Not on file  Stress: Not on file  Social Connections: Not on file  Intimate Partner Violence: Not on file    Past Surgical History:  Procedure Laterality Date   CESAREAN SECTION     CHOLECYSTECTOMY     CHOLECYSTECTOMY     EYE SURGERY     repair corneal tare   knee arthroscopy x 2     Left x 2; right x 1   WISDOM TOOTH EXTRACTION      Family History  Problem Relation Age of Onset   Cancer Mother        skin   Cancer Father        skin   Heart disease Father    Colon cancer Neg Hx    Esophageal cancer Neg Hx    Rectal cancer Neg Hx    Stomach cancer Neg Hx     Allergies  Allergen Reactions   Sulfa Antibiotics     Viviann Spare  Johnson"s syndrome   Gramineae Pollens Other (See Comments)    Current Outpatient Medications on File Prior to Visit  Medication Sig Dispense Refill   ELDERBERRY PO Take by mouth.     montelukast (SINGULAIR) 10 MG tablet TAKE 1 TABLET BY MOUTH AT BEDTIME 90 tablet 3   Norethindrone-Ethinyl Estradiol-Fe Biphas (LO LOESTRIN FE) 1 MG-10 MCG / 10 MCG tablet Take 1 tablet by mouth daily. 84 tablet 2   Omega-3 Fatty Acids (FISH OIL PO) Take by mouth.     traZODone (DESYREL) 100 MG tablet Take 1 tablet (100 mg total) by mouth at bedtime. Take as needed. 30 tablet 0   TURMERIC PO Take by mouth.     LORazepam (ATIVAN) 2 MG tablet Take 1 tablet (2 mg total) by mouth every 8 (eight) hours as needed for anxiety. (Patient not taking: Reported on 11/12/2022) 30 tablet 1   No current facility-administered medications on file prior to visit.    BP 110/60   Pulse 63   Temp 98.1 F (36.7 C) (Oral)   Ht 5\' 5"  (1.651 m)   Wt 219 lb (99.3 kg)   LMP  (LMP Unknown) Comment: pt is on birth control  SpO2 97%   BMI 36.44 kg/m       Objective:   Physical Exam Vitals and nursing note reviewed.  Constitutional:      Appearance: Normal appearance.  Musculoskeletal:        General: No swelling or deformity. Normal range of motion.  Skin:    General: Skin is warm and dry.  Neurological:     General: No focal deficit present.     Mental Status: She is oriented to person, place, and time.  Psychiatric:        Mood and Affect: Mood normal.        Behavior: Behavior normal.        Thought Content: Thought content normal.        Judgment: Judgment normal.       Assessment & Plan:  1. Intrinsic asthma  - montelukast (SINGULAIR) 10 MG tablet; TAKE 1 TABLET BY MOUTH AT BEDTIME  Dispense: 90 tablet; Refill: 3  2. Insomnia, unspecified type  - traZODone (DESYREL) 100 MG tablet; Take 1 tablet (100 mg total) by mouth at bedtime. Take as needed.  Dispense: 90 tablet; Refill: 3  3. Multiple joint  pain -Discussed doing x-rays in the office today but she would  like to hold off until she is seen by rheumatology.  Also referred to physical medicine and rehab.  - Ambulatory referral to Rheumatology Ambulatory referral to Physical Medicine Rehab - meloxicam (MOBIC) 15 MG tablet; Take 1 tablet (15 mg total) by mouth daily.  Dispense: 90 tablet; Refill: 0  Shirline Frees, NP  Time spent with patient today was 31 minutes which consisted of chart review, discussing multiple joint pain, work up, treatment answering questions and documentation.

## 2022-11-15 DIAGNOSIS — L821 Other seborrheic keratosis: Secondary | ICD-10-CM | POA: Diagnosis not present

## 2022-11-15 DIAGNOSIS — D485 Neoplasm of uncertain behavior of skin: Secondary | ICD-10-CM | POA: Diagnosis not present

## 2022-11-15 DIAGNOSIS — L570 Actinic keratosis: Secondary | ICD-10-CM | POA: Diagnosis not present

## 2022-11-15 DIAGNOSIS — L814 Other melanin hyperpigmentation: Secondary | ICD-10-CM | POA: Diagnosis not present

## 2022-11-15 DIAGNOSIS — D225 Melanocytic nevi of trunk: Secondary | ICD-10-CM | POA: Diagnosis not present

## 2022-11-18 ENCOUNTER — Encounter: Payer: Self-pay | Admitting: Physical Medicine and Rehabilitation

## 2022-11-19 ENCOUNTER — Encounter: Payer: Self-pay | Admitting: Adult Health

## 2022-11-19 NOTE — Telephone Encounter (Signed)
Can you help with this please? Do I need to send in another referral?

## 2022-11-22 ENCOUNTER — Encounter: Payer: Self-pay | Admitting: Rheumatology

## 2022-11-22 ENCOUNTER — Ambulatory Visit (INDEPENDENT_AMBULATORY_CARE_PROVIDER_SITE_OTHER): Payer: 59

## 2022-11-22 ENCOUNTER — Ambulatory Visit: Payer: 59

## 2022-11-22 ENCOUNTER — Ambulatory Visit: Payer: 59 | Attending: Rheumatology | Admitting: Rheumatology

## 2022-11-22 VITALS — BP 133/78 | HR 80 | Resp 14 | Ht 65.5 in | Wt 218.0 lb

## 2022-11-22 DIAGNOSIS — M25561 Pain in right knee: Secondary | ICD-10-CM

## 2022-11-22 DIAGNOSIS — M79672 Pain in left foot: Secondary | ICD-10-CM

## 2022-11-22 DIAGNOSIS — G8929 Other chronic pain: Secondary | ICD-10-CM

## 2022-11-22 DIAGNOSIS — M7062 Trochanteric bursitis, left hip: Secondary | ICD-10-CM | POA: Diagnosis not present

## 2022-11-22 DIAGNOSIS — M25552 Pain in left hip: Secondary | ICD-10-CM

## 2022-11-22 DIAGNOSIS — M79641 Pain in right hand: Secondary | ICD-10-CM

## 2022-11-22 DIAGNOSIS — M533 Sacrococcygeal disorders, not elsewhere classified: Secondary | ICD-10-CM

## 2022-11-22 DIAGNOSIS — M542 Cervicalgia: Secondary | ICD-10-CM

## 2022-11-22 DIAGNOSIS — M791 Myalgia, unspecified site: Secondary | ICD-10-CM

## 2022-11-22 DIAGNOSIS — M79642 Pain in left hand: Secondary | ICD-10-CM

## 2022-11-22 DIAGNOSIS — K1379 Other lesions of oral mucosa: Secondary | ICD-10-CM

## 2022-11-22 DIAGNOSIS — M2550 Pain in unspecified joint: Secondary | ICD-10-CM

## 2022-11-22 DIAGNOSIS — E559 Vitamin D deficiency, unspecified: Secondary | ICD-10-CM

## 2022-11-22 DIAGNOSIS — I7300 Raynaud's syndrome without gangrene: Secondary | ICD-10-CM

## 2022-11-22 DIAGNOSIS — L930 Discoid lupus erythematosus: Secondary | ICD-10-CM

## 2022-11-22 DIAGNOSIS — R21 Rash and other nonspecific skin eruption: Secondary | ICD-10-CM | POA: Diagnosis not present

## 2022-11-22 DIAGNOSIS — M25551 Pain in right hip: Secondary | ICD-10-CM

## 2022-11-22 DIAGNOSIS — M545 Low back pain, unspecified: Secondary | ICD-10-CM

## 2022-11-22 DIAGNOSIS — M25562 Pain in left knee: Secondary | ICD-10-CM

## 2022-11-22 DIAGNOSIS — Z8269 Family history of other diseases of the musculoskeletal system and connective tissue: Secondary | ICD-10-CM

## 2022-11-22 DIAGNOSIS — M79671 Pain in right foot: Secondary | ICD-10-CM | POA: Diagnosis not present

## 2022-11-22 DIAGNOSIS — R5383 Other fatigue: Secondary | ICD-10-CM

## 2022-11-22 NOTE — Progress Notes (Signed)
Office Visit Note  Patient: Mia Thomas             Date of Birth: 10/10/1970           MRN: [id number]             PCP: Shirline Frees, NP Referring: Shirline Frees, NP Visit Date: 11/22/2022 Occupation: @GUAROCC @  Subjective:  Pain in multiple joints and rash  History of Present Illness: Mia Thomas is a 52 y.o. female seen in consultation for the evaluation of polyarthralgia and rash.  She was accompanied by her husband Dr. Myrtie Neither. According to the patient she developed Stevens-Johnson syndrome from sulfa at age 52.  She states she had joint pain for many years which eventually resolved.  About 20 years ago she started experiencing intermittent rash on her trunk and extremities.  She states the rash come as ulcers or blisters which come and go.  The rash becomes very hot and pruritic.  It is very often on her ankles and her feet.  She brought several pictures on her cell phone.  She states these episodes happen about once or twice a month.  She has been seeing Dr. Doreen Beam for skin check.  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  Patient states she gets rash more often during the summertime.  She states when she experiences the symptoms she usually feels very tired and also gets low-grade fever.  She gives history of joint pain and discomfort for the last 10 years.  She states the joint pain has been in her cervical spine, trapezius region, lower back, hands, hips, knees and her feet.  She had arthroscopic surgery on her bilateral knee joints in the past.  She notices intermittent swelling in her hands and her knee joints.  She has seen Dr. Magnus Ivan few years back for left IT band injury.  She also had x-rays of her thoracic and lumbar spine at the time which showed some scoliosis.  She feels her left leg is longer than her right leg.  She complains of discomfort in her feet especially the right second hammertoe, the tops of her  feet and the left Achilles tendon.  She denies any history of Achilles tendinitis or plantar fasciitis.  There is no history of uveitis.  Her husband is concerned that she may have psoriatic arthritis because he has noticed almost sausage digits on her hands.  She denies any personal or family history of psoriasis.  She states she gets oral ulcers frequently about once a month which are under her tongue and on her cheeks.  She has not noticed any genital ulcers.  There is history of dermatomyositis in her mother.  She is gravida 2, para 2.  She has 1 adopted child.  There is no history of preeclampsia or DVTs.    Activities of Daily Living:  Patient reports morning stiffness for 30 minutes.   Patient Reports nocturnal pain.  Difficulty dressing/grooming: Denies Difficulty climbing stairs: Denies Difficulty getting out of chair: Denies Difficulty using hands for taps, buttons, cutlery, and/or writing: Denies  Review of Systems  Constitutional:  Positive for fatigue.  HENT:  Positive for mouth sores. Negative for mouth dryness.   Eyes:  Negative for dryness.  Respiratory:  Negative for shortness of breath.   Cardiovascular:  Negative for chest pain and palpitations.  Gastrointestinal:  Negative for abdominal pain, blood in stool, constipation, diarrhea and heartburn.  Endocrine:  Negative for increased urination.  Genitourinary:  Negative for decreased urine output.  Musculoskeletal:  Positive for joint pain, gait problem, joint pain, joint swelling, myalgias, muscle weakness, morning stiffness and myalgias.  Skin:  Positive for color change, rash and sensitivity to sunlight. Negative for hair loss.  Allergic/Immunologic: Negative for susceptible to infections.  Neurological:  Positive for dizziness and headaches.       Tension  Hematological:  Negative for swollen glands.  Psychiatric/Behavioral:  Negative for depressed mood and sleep disturbance. The patient is nervous/anxious.     PMFS  History:  Patient Active Problem List   Diagnosis Date Noted   Joint swelling 05/29/2022   SI (sacroiliac) joint dysfunction 12/19/2020   Somatic dysfunction of spine, sacral 12/19/2020   Pain of left hip joint 02/07/2020   Panic anxiety syndrome 10/16/2016   Perimenopausal symptoms 10/16/2016   Depression 04/28/2016   Anxiety 04/28/2016   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   Allergic rhinitis 01/08/2016    Past Medical History:  Diagnosis Date   Allergy    Anxiety    Asthma    Depression    Scoliosis    Shingles    Stevens-Johnson syndrome (HCC)    as a child     Family History  Problem Relation Age of Onset   Cancer Mother        skin   Cancer Father        skin   Heart disease Father    Colon cancer Neg Hx    Esophageal cancer Neg Hx    Rectal cancer Neg Hx    Stomach cancer Neg Hx    Past Surgical History:  Procedure Laterality Date   CESAREAN SECTION     CHOLECYSTECTOMY     CHOLECYSTECTOMY     EYE SURGERY     repair corneal tare   knee arthroscopy x 2     Left x 2; right x 1   WISDOM TOOTH EXTRACTION     Social History   Social History Narrative   Lives with her husband, Sherilyn Cooter a local GI physician and the youngest of their 3 children.   Originally from [place], and moved to [place] from [place] in 2016.   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 She is considering what else she will do with her time as her children are much less dependent on her.   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   Immunization History  Administered Date(s) Administered   COVID-19, mRNA, vaccine(Comirnaty)12  years and older 12/21/2021   Hepatitis A, Adult 08/18/2019, 03/09/2020   Influenza Inj Mdck Quad Pf 12/05/2016   Influenza,inj,Quad PF,6+ Mos 11/27/2017   Influenza-Unspecified 12/15/2013, 12/05/2016, 12/04/2020, 11/20/2021   PFIZER(Purple Top)SARS-COV-2 Vaccination 03/23/2019, 04/12/2019, 11/14/2019   Tdap 08/27/2007, 04/05/2010, 09/01/2020   Zoster Recombinant(Shingrix) 07/27/2021     Objective: Vital Signs: BP 133/78 (BP Location: Right Arm, Patient Position: Sitting, Cuff Size: Normal)   Pulse 80   Resp 14   Ht 5' 5.5" (1.664 m)   Wt 218 lb (98.9 kg)   LMP  (LMP Unknown) Comment: pt is on birth control  BMI 35.73 kg/m    Physical Exam  Vitals and nursing note reviewed.  Constitutional:      Appearance: She is well-developed.  HENT:     Head: Normocephalic and atraumatic.  Eyes:     Conjunctiva/sclera: Conjunctivae normal.  Cardiovascular:     Rate and Rhythm: Normal rate and regular rhythm.     Heart sounds: Normal heart sounds.  Pulmonary:     Effort: Pulmonary effort is normal.     Breath sounds: Normal breath sounds.  Abdominal:     General: Bowel sounds are normal.     Palpations: Abdomen is soft.  Musculoskeletal:     Cervical back: Normal range of motion.  Lymphadenopathy:     Cervical: No cervical adenopathy.  Skin:    General: Skin is warm and dry.     Capillary Refill: Capillary refill takes less than 2 seconds.  Neurological:     Mental Status: She is alert and oriented to person, place, and time.  Psychiatric:        Behavior: Behavior normal.      Musculoskeletal Exam: Cervical, thoracic and lumbar spine were in good range of motion.  She had tenderness over SI joints.  She had some discomfort over the left trapezius region.  Shoulder joints, elbow joints, wrist joints, MCPs PIPs and DIPs were in good range of motion.  She had bilateral PIP and DIP thickening with no synovitis.  Hip joints were in good range of motion with some discomfort.  She had  tenderness over left trochanteric region.  Knee joints were in good range of motion without any warmth swelling or effusion.  There was no synovitis over ankles or MTPs.  She had a right second hammertoe.  She has mild dorsal spurring.  No plantar fasciitis or Achilles tendinitis was noted.  She had no difficulty walking on her tiptoes or her heels.  She had no difficulty getting up from the squatting position.  CDAI Exam: CDAI Score: -- Patient Global: --; Provider Global: -- Swollen: --; Tender: -- Joint Exam 11/22/2022   No joint exam has been documented for this visit   There is currently no information documented on the homunculus. Go to the Rheumatology activity and complete the homunculus joint exam.  Investigation: No additional findings.  Imaging: No results found.  Recent Labs: Lab Results  Component Value Date   WBC 6.0 04/26/2022   HGB 13.8 04/26/2022   PLT 474.0 (H) 04/26/2022   NA 138 04/26/2022   K 4.0 04/26/2022   CL 104 04/26/2022   CO2 24 04/26/2022   GLUCOSE 95 04/26/2022   BUN 13 04/26/2022   CREATININE 0.69 04/26/2022   BILITOT 0.5 04/26/2022   ALKPHOS 66 04/26/2022   AST 22 04/26/2022   ALT 23 04/26/2022   PROT 7.4 04/26/2022   ALBUMIN 4.6 04/26/2022   CALCIUM 9.4 04/26/2022   GFRAA 120 02/26/2018   April 26, 2022 ANA negative, RF negative, TSH normal, ESR 33, CRP 1.5, hepatitis C non-reactive  January 09, 2019 HLA-B27 negative, uric acid 5.1, RF negative, ANA negative, ESR 19  Speciality Comments: No specialty comments available.  Procedures:  No procedures performed Allergies: Sulfa antibiotics and Gramineae pollens   Assessment / Plan:     Visit Diagnoses: Polyarthralgia-patient complains of pain and discomfort in multiple joints for at least 10 years.  She gives history of intermittent swelling in her hands and her knee joints.  Her husband was a physician has noted dactylitis in her hands.  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  She states the rash comes on her trunk and extremities.  She was recently evaluated by [facility] dermatology where she had a skin biopsy which was positive for lupus tumidus.  I do not have results available to confirm the diagnosis.  Patient gives history of oral ulcers, fatigue, Raynauds, photosensitivity and rashes.  I will obtain additional labs today.  Plan: Protein / creatinine ratio, urine, Sedimentation rate, ANA, Anti-scleroderma antibody, RNP Antibody, Anti-Smith antibody, Sjogrens syndrome-A extractable nuclear antibody, Sjogrens syndrome-B extractable nuclear antibody, Anti-DNA antibody, double-stranded, C3 and C4, Beta-2 glycoprotein antibodies, Cardiolipin antibodies, IgG, IgM, IgA, Lupus Anticoagulant Eval w/Reflex.  I advised patient to stop elderberry as it can induce autoimmune disease with chronic use.  Rash and other nonspecific skin eruption -she gives history of recurrent rash over her trunk and extremities for the last 20 years.  She describes rash to be hot and pruritic which resolved by itself.  She also gives history of oral ulcers but denies any history of genital ulcers.  Plan: Pan-ANCA  Raynaud's disease without gangrene-she gives history of Raynaud's phenomenon in her hands and her feet.  No nailbed capillary changes or sclerodactyly was noted.  She had good capillary refill.  Recurrent oral ulcers-she gives history of recurrent oral ulcers.  Pain in both hands -she complains of pain and discomfort in her bilateral hands.  Her husband has noted dactylitis.  No synovitis was noted on the examination today.  PIP and DIP thickening was noted.  Clinical findings were consistent with osteoarthritis.  No nail pitting or nail dystrophy was noted.  Plan: XR Hand 2 View Right, XR Hand 2 View Left, x-rays of the bilateral hands show early degenerative changes.  Right fifth finger DIP joint showed severe narrowing and erosive  changes.  Cyclic citrul peptide antibody, IgG  Chronic pain of both hips-she complains of discomfort in her bilateral hips.  Patient states she has had labral tear in the past.  Chronic pain of both knees-she had arthroscopic surgery on bilateral knee joints in the past.  She gives history of intermittent swelling in her knees.  No warmth swelling or effusion was noted today.  Trochanteric bursitis, left hip-she has some tenderness over left trochanteric region.  Pain in both feet -she complains of discomfort in her feet.  She had dorsal spurs and hammertoes.  She also had tenderness across her MTPs in her left foot.  No synovitis was noted.  No dactylitis was noted.  No nail dystrophy or nail pitting was noted.  Plan: XR Foot 2 Views Right, XR Foot 2 Views Left.  Early degenerative changes were noted in the x-rays of the feet.  Chronic SI joint pain -she complains of discomfort in bilateral SI joints.  HLA-B27 was negative on January 09, 2019.  Plan: XR Pelvis 1-2 Views, no SI joint sclerosis or narrowing was noted.  No hip joint narrowing was noted.  Neck pain -she complains of neck pain and stiffness.  She had left trapezius spasm.  She had good range of motion of the cervical spine.  Plan: XR Cervical Spine 2 or 3 views.  Mild C5-C6 narrowing was noted.  Chronic midline low back pain without sciatica -she complains of thoracic and lumbar spine discomfort.  Patient has mild scoliosis.  Plan: XR Lumbar Spine 2-3 Views mild L5-S1 narrowing was noted.  Mild facet joint arthropathy was noted.  Myalgia -she complains of pain in muscles.  Her mother has dermatomyositis.  Plan:  CK  Other fatigue -she gives history of chronic fatigue which is worse with some episodes of rash.  Plan: CBC with Differential/Platelet, COMPLETE METABOLIC PANEL WITH GFR  Vitamin D deficiency -history of vitamin D deficiency.  Plan: VITAMIN D 25 Hydroxy (Vit-D Deficiency, Fractures)  Family history of  dermatomyositis-mother  Orders: Orders Placed This Encounter  Procedures   XR Hand 2 View Right   XR Hand 2 View Left   XR Foot 2 Views Right   XR Foot 2 Views Left   XR Pelvis 1-2 Views   XR Lumbar Spine 2-3 Views   XR Cervical Spine 2 or 3 views   CBC with Differential/Platelet   COMPLETE METABOLIC PANEL WITH GFR   Protein / creatinine ratio, urine   Sedimentation rate   CK   Cyclic citrul peptide antibody, IgG   ANA   Anti-scleroderma antibody   RNP Antibody   Anti-Smith antibody   Sjogrens syndrome-A extractable nuclear antibody   Sjogrens syndrome-B extractable nuclear antibody   Anti-DNA antibody, double-stranded   C3 and C4   Beta-2 glycoprotein antibodies   Cardiolipin antibodies, IgG, IgM, IgA   Lupus Anticoagulant Eval w/Reflex   Pan-ANCA   VITAMIN D 25 Hydroxy (Vit-D Deficiency, Fractures)   No orders of the defined types were placed in this encounter.   Face-to-face time spent with patient was over 60 minutes. Greater than 50% of time was spent in counseling and coordination of care.  Follow-Up Instructions: Return for Polyarthralgia, rash.   Pollyann Savoy, MD  Note - This record has been created using Animal nutritionist.  Chart creation errors have been sought, but may not always  have been located. Such creation errors do not reflect on  the standard of medical care.

## 2022-11-27 LAB — COMPLETE METABOLIC PANEL WITH GFR
AG Ratio: 1.7 (calc) (ref 1.0–2.5)
ALT: 25 U/L (ref 6–29)
AST: 17 U/L (ref 10–35)
Albumin: 4.5 g/dL (ref 3.6–5.1)
Alkaline phosphatase (APISO): 68 U/L (ref 37–153)
BUN: 11 mg/dL (ref 7–25)
CO2: 24 mmol/L (ref 20–32)
Calcium: 9.5 mg/dL (ref 8.6–10.4)
Chloride: 105 mmol/L (ref 98–110)
Creat: 0.79 mg/dL (ref 0.50–1.03)
Globulin: 2.6 g/dL (calc) (ref 1.9–3.7)
Glucose, Bld: 108 mg/dL — ABNORMAL HIGH (ref 65–99)
Potassium: 4.6 mmol/L (ref 3.5–5.3)
Sodium: 138 mmol/L (ref 135–146)
Total Bilirubin: 0.4 mg/dL (ref 0.2–1.2)
Total Protein: 7.1 g/dL (ref 6.1–8.1)
eGFR: 90 mL/min/{1.73_m2} (ref >=60)

## 2022-11-27 LAB — CBC WITH DIFFERENTIAL/PLATELET
Absolute Monocytes: 343 cells/uL (ref 200–950)
Basophils Absolute: 21 cells/uL (ref 0–200)
Basophils Relative: 0.4 %
Eosinophils Absolute: 52 cells/uL (ref 15–500)
Eosinophils Relative: 1 %
HCT: 43.3 % (ref 35.0–45.0)
Hemoglobin: 14 g/dL (ref 11.7–15.5)
Lymphs Abs: 1768 cells/uL (ref 850–3900)
MCH: 29.8 pg (ref 27.0–33.0)
MCHC: 32.3 g/dL (ref 32.0–36.0)
MCV: 92.1 fL (ref 80.0–100.0)
MPV: 9.4 fL (ref 7.5–12.5)
Monocytes Relative: 6.6 %
Neutro Abs: 3016 cells/uL (ref 1500–7800)
Neutrophils Relative %: 58 %
Platelets: 389 10*3/uL (ref 140–400)
RBC: 4.7 10*6/uL (ref 3.80–5.10)
RDW: 12.7 % (ref 11.0–15.0)
Total Lymphocyte: 34 %
WBC: 5.2 10*3/uL (ref 3.8–10.8)

## 2022-11-27 LAB — ANA: Anti Nuclear Antibody (ANA): NEGATIVE

## 2022-11-27 LAB — RNP ANTIBODY: Ribonucleic Protein(ENA) Antibody, IgG: <1 AI

## 2022-11-27 LAB — SJOGRENS SYNDROME-A EXTRACTABLE NUCLEAR ANTIBODY: SSA (Ro) (ENA) Antibody, IgG: <1 AI

## 2022-11-27 LAB — RFLX HEXAGONAL PHASE CONFIRM: Hexagonal Phase Conf: NEGATIVE

## 2022-11-27 LAB — C3 AND C4
C3 Complement: 169 mg/dL (ref 83–193)
C4 Complement: 50 mg/dL (ref 15–57)

## 2022-11-27 LAB — CARDIOLIPIN ANTIBODIES, IGG, IGM, IGA
Anticardiolipin IgA: <2 APL-U/mL (ref <20.0)
Anticardiolipin IgG: <2 GPL-U/mL (ref <20.0)
Anticardiolipin IgM: <2 MPL-U/mL (ref <20.0)

## 2022-11-27 LAB — LUPUS ANTICOAGULANT EVAL W/ REFLEX
PTT-LA Screen: 46 s — ABNORMAL HIGH (ref <=40)
dRVVT: 38 s (ref <=45)

## 2022-11-27 LAB — PROTEIN / CREATININE RATIO, URINE
Creatinine, Urine: 124 mg/dL (ref 20–275)
Protein/Creat Ratio: 105 mg/g creat (ref 24–184)
Protein/Creatinine Ratio: 0.105 mg/mg creat (ref 0.024–0.184)
Total Protein, Urine: 13 mg/dL (ref 5–24)

## 2022-11-27 LAB — PAN-ANCA
ANCA SCREEN: NEGATIVE
Myeloperoxidase Abs: <1 AI (ref <1.0)
Serine Protease 3: <1 AI (ref <1.0)

## 2022-11-27 LAB — BETA-2 GLYCOPROTEIN ANTIBODIES
Beta-2 Glyco 1 IgA: <2 U/mL (ref <20.0)
Beta-2 Glyco 1 IgM: <2 U/mL (ref <20.0)
Beta-2 Glyco I IgG: <2 U/mL (ref <20.0)

## 2022-11-27 LAB — ANTI-SCLERODERMA ANTIBODY: Scleroderma (Scl-70) (ENA) Antibody, IgG: <1 AI

## 2022-11-27 LAB — SJOGRENS SYNDROME-B EXTRACTABLE NUCLEAR ANTIBODY: SSB (La) (ENA) Antibody, IgG: <1 AI

## 2022-11-27 LAB — SEDIMENTATION RATE: Sed Rate: 11 mm/h (ref 0–30)

## 2022-11-27 LAB — CK: Total CK: 38 U/L (ref 29–143)

## 2022-11-27 LAB — ANTI-SMITH ANTIBODY: ENA SM Ab Ser-aCnc: <1 AI

## 2022-11-27 LAB — VITAMIN D 25 HYDROXY (VIT D DEFICIENCY, FRACTURES): Vit D, 25-Hydroxy: 80 ng/mL (ref 30–100)

## 2022-11-27 LAB — ANTI-DNA ANTIBODY, DOUBLE-STRANDED: ds DNA Ab: <1 IU/mL

## 2022-11-27 LAB — CYCLIC CITRUL PEPTIDE ANTIBODY, IGG: Cyclic Citrullin Peptide Ab: <16 UNITS

## 2022-11-28 NOTE — Progress Notes (Signed)
CBC, CMP, sed rate, urine protein creatinine ratio, CK, anti-CCP, ANA, SCL 70, Smith, RNP, dsDNA, SCL 70, SSA, SSB, beta-2 GP 1, anticardiolipin, lupus anticoagulant, complements, p-ANCA, vitamin D are all within normal limits.  I will discuss results at the follow-up visit.

## 2022-12-12 NOTE — Progress Notes (Signed)
Office Visit Note  Patient: Mia Thomas             Date of Birth: 05-01-1970           MRN: [id number]             PCP: Shirline Frees, NP Referring: Shirline Frees, NP Visit Date: 12/25/2022 Occupation: @GUAROCC @  Subjective:  Pain in multiple joints  History of Present Illness: Mia Thomas is a 52 y.o. female with today after her initial visit.  As she complains of ongoing pain and discomfort in multiple joints.  She states she has not had recurrence of rash since the last visit.  She continues to have pain and stiffness in her bilateral hands, hips and knees.  She also has a stiffness in her neck and lower back.  She continues to have generalized achiness.    Activities of Daily Living:  Patient reports morning stiffness for 20 minutes intermittently.  Patient Reports nocturnal pain.  Difficulty dressing/grooming: Denies Difficulty climbing stairs: Denies Difficulty getting out of chair: Denies Difficulty using hands for taps, buttons, cutlery, and/or writing: Denies  Review of Systems  Constitutional:  Negative for fatigue.  HENT:  Positive for mouth sores and mouth dryness.   Eyes:  Positive for dryness.  Respiratory:  Negative for cough and shortness of breath.   Cardiovascular:  Positive for palpitations. Negative for chest pain.  Gastrointestinal:  Negative for blood in stool, constipation and diarrhea.  Endocrine: Negative for increased urination.  Genitourinary:  Negative for involuntary urination.  Musculoskeletal:  Positive for joint pain, joint pain, joint swelling, myalgias, morning stiffness, muscle tenderness and myalgias. Negative for muscle weakness.  Skin:  Positive for color change and sensitivity to sunlight. Negative for rash and hair loss.  Allergic/Immunologic: Negative for susceptible to infections.  Neurological:  Negative for dizziness and headaches.  Hematological:  Negative for swollen glands.  Psychiatric/Behavioral:  Positive for sleep  disturbance. Negative for depressed mood. The patient is not nervous/anxious.     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   Allergic rhinitis 01/08/2016    Past Medical History:  Diagnosis Date   Allergy    Anxiety    Asthma    Depression    Scoliosis    Shingles    Stevens-Johnson syndrome (HCC)    as a child     Family History  Problem Relation Age of Onset   Cancer Mother        skin   Atrial fibrillation Mother    Cancer Father        skin   Heart disease Father    Asthma Father    Rheum arthritis Father    Bladder Cancer Father    Prostate cancer Father    Other Father        shingles   Cancer Sister        cluster cancers   Hypertension Sister    Glaucoma Sister    Hypertension Brother    Rheum arthritis Paternal Aunt    Rheum arthritis Paternal Aunt    Rheum arthritis Paternal Education officer, community    Healthy Daughter    Healthy Son  Rheum arthritis Cousin    Colon cancer Neg Hx    Esophageal cancer Neg Hx    Rectal cancer Neg Hx    Stomach cancer Neg Hx    Past Surgical History:  Procedure Laterality Date   CESAREAN SECTION     CHOLECYSTECTOMY     CHOLECYSTECTOMY     EYE SURGERY     repair corneal tare   knee arthroscopy x 2     Left x 2; right x 1   WISDOM TOOTH EXTRACTION     Social History   Social History Narrative   Lives with her husband, Sherilyn Cooter a local GI physician and the youngest of their 3 children.   Originally from [place], and moved to [place] from [place] in 2016.   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 She is considering what else she will do with her time as her children are much less dependent on her.   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   Immunization History  Administered Date(s) Administered   Hepatitis A, Adult 08/18/2019, 03/09/2020   Influenza Inj Mdck Quad Pf 12/05/2016   Influenza,inj,Quad PF,6+ Mos 11/27/2017   Influenza-Unspecified 12/15/2013, 12/05/2016, 12/04/2020, 11/20/2021   PFIZER(Purple Top)SARS-COV-2 Vaccination 03/23/2019, 04/12/2019, 11/14/2019   Pfizer(Comirnaty)Fall Seasonal Vaccine 12 years and older 12/21/2021   Tdap 08/27/2007, 04/05/2010, 09/01/2020   Zoster Recombinant(Shingrix) 07/27/2021     Objective: Vital Signs: BP 121/79 (BP Location: Left Arm, Patient Position: Sitting, Cuff Size: Normal)   Pulse 63   Ht 5\' 6"  (1.676 m)   Wt 214 lb (97.1 kg)   BMI 34.54 kg/m    Physical Exam Vitals and nursing note reviewed.  Constitutional:      Appearance: She is well-developed.  HENT:     Head: Normocephalic and atraumatic.  Eyes:     Conjunctiva/sclera: Conjunctivae normal.  Cardiovascular:     Rate and Rhythm: Normal rate and regular rhythm.     Heart sounds: Normal heart sounds.  Pulmonary:     Effort: Pulmonary effort is normal.     Breath sounds: Normal breath sounds.  Abdominal:     General: Bowel sounds are normal.     Palpations: Abdomen is soft.  Musculoskeletal:     Cervical back: Normal range of motion.  Lymphadenopathy:     Cervical: No cervical adenopathy.  Skin:    General: Skin is warm and dry.     Capillary Refill: Capillary refill takes less than 2 seconds.  Neurological:     Mental Status: She is alert and oriented to person, place, and time.   Psychiatric:        Behavior: Behavior normal.      Musculoskeletal Exam: Cervical, thoracic and lumbar spine were in good range of motion.  She had no tenderness over SI joints today.  Shoulder joints, elbow joints, wrist joints, MCPs PIPs and DIPs were in good range of motion without any synovitis.  Bilateral PIP and DIP thickening with no synovitis was noted.  Hip joints and knee joints were in good range of motion without any warmth swelling or effusion.  There was no tenderness over ankles or MTPs.  CDAI  Exam: CDAI Score: -- Patient Global: --; Provider Global: -- Swollen: --; Tender: -- Joint Exam 12/25/2022   No joint exam has been documented for this visit   There is currently no information documented on the homunculus. Go to the Rheumatology activity and complete the homunculus joint exam.  Investigation: No additional findings.  Imaging: No results found.  Recent Labs: Lab Results  Component Value Date   WBC 5.2 11/22/2022   HGB 14.0 11/22/2022   PLT 389 11/22/2022   NA 138 11/22/2022   K 4.6 11/22/2022   CL 105 11/22/2022   CO2 24 11/22/2022   GLUCOSE 108 (H) 11/22/2022   BUN 11 11/22/2022   CREATININE 0.79 11/22/2022   BILITOT 0.4 11/22/2022   ALKPHOS 66 04/26/2022   AST 17 11/22/2022   ALT 25 11/22/2022   PROT 7.1 11/22/2022   ALBUMIN 4.6 04/26/2022   CALCIUM 9.5 11/22/2022   GFRAA 120 02/26/2018     Speciality Comments: No specialty comments available.  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  All autoimmune workup negative.  I discussed the option of hydroxychloroquine use.  Patient stated that she will discuss topical agents with her dermatologist that she is not ready for oral medication.  Use of sun protection and sunscreen was discussed.  She has no clinical features of  systemic lupus.November 22, 2022 ANA negative, ENA negative, C3-C4 normal, beta-2 GP 1 negative, anticardiolipin negative, lupus anticoagulant negative, urine protein creatinine ratio normal, anti-CCP negative, sed rate 11, CK 38, MPO negative, serum proteinase 3 negative, ANCA negative, vitamin D 80.  Lab results were discussed with the patient.  Raynaud's disease without gangrene - History of Raynauds.  She had good capillary refill.  No nailbed capillary changes or sclerodactyly was noted.  Recurrent oral ulcers - History of recurrent oral ulcers.  According to the patient she had no recent oral ulcers.  Rash and other nonspecific skin eruption - History of recurrent rash for the last 20 years.  All autoimmune workup negative.  Polyarthralgia-she complete pain and discomfort in multiple joints.  No synovitis was noted.  Pain in both hands -no synovitis was noted on the examination.  Clinical findings were consistent with osteoarthritis.  X-rays showed right fifth DIP joint severe narrowing and erosive changes.  X-rays were reviewed with the patient.  Her husband noted dactylitis in the past.  I advised patient to contact me if she develops any joint swelling.  Joint protection muscle strengthening was discussed.  A handout on hand exercises was provided.  Chronic pain of both hips - History of labral tear in the past.  She has ongoing pain in bilateral hips.  X-rays of the bilateral hip joints were unremarkable.  Trochanteric bursitis, left hip-she has been doing IT band stretches which are helpful.  Chronic pain of both knees -she has intermittent discomfort in her knee joints.  No warmth swelling or effusion was noted today on the examination.   Pain in both feet - Dorsal spurs and hammertoes were noted.  No synovitis was noted.  No dactylitis was noted.  Early degenerative changes were noted on the x-rays.  X-rays were reviewed with the patient.  Chronic SI joint pain - HLA-B27 negative in  2020.  X-rays of the SI joints were unremarkable.  X-rays were reviewed with the patient.  DDD (degenerative disc disease), cervical -  History of neck pain and stiffness.  She had bilateral trapezius spasm.  Mild C5-6 narrowing was noted on the x-rays obtained at the last visit.  X-rays were reviewed with the patient.  A handout on C-spine exercises was given.  Chronic midline low back pain without sciatica - X-rays showed mild scoliosis, L5-S1 narrowing was noted.  Mild facet joint arthropathy was noted.  X-rays were reviewed with the patient.  Core strengthening exercises were discussed and a handout on exercises was given.  Myalgia - CK normal.  Her mother had dermatomyositis.  She has no muscular weakness or tenderness.  Other fatigue  Vitamin D deficiency-she has been on vitamin D.  Vitamin D was 80 today.  Family history of dermatomyositis-mother  Orders: No orders of the defined types were placed in this encounter.  No orders of the defined types were placed in this encounter.    Follow-Up Instructions: Return if symptoms worsen or fail to improve, for Osteoarthritis.   Pollyann Savoy, MD  Note - This record has been created using Animal nutritionist.  Chart creation errors have been sought, but may not always  have been located. Such creation errors do not reflect on  the standard of medical care.

## 2022-12-23 ENCOUNTER — Encounter: Payer: Self-pay | Admitting: Physical Medicine and Rehabilitation

## 2022-12-23 ENCOUNTER — Encounter: Payer: 59 | Attending: Physical Medicine and Rehabilitation | Admitting: Physical Medicine and Rehabilitation

## 2022-12-23 VITALS — BP 117/76 | HR 68 | Ht 65.5 in | Wt 214.0 lb

## 2022-12-23 DIAGNOSIS — M351 Other overlap syndromes: Secondary | ICD-10-CM | POA: Insufficient documentation

## 2022-12-23 DIAGNOSIS — M2550 Pain in unspecified joint: Secondary | ICD-10-CM | POA: Insufficient documentation

## 2022-12-23 DIAGNOSIS — M47819 Spondylosis without myelopathy or radiculopathy, site unspecified: Secondary | ICD-10-CM | POA: Diagnosis not present

## 2022-12-23 DIAGNOSIS — M7600 Gluteal tendinitis, unspecified hip: Secondary | ICD-10-CM | POA: Diagnosis not present

## 2022-12-23 DIAGNOSIS — M7918 Myalgia, other site: Secondary | ICD-10-CM | POA: Insufficient documentation

## 2022-12-23 NOTE — Patient Instructions (Addendum)
Counterforce brace Extracorporeal shockwave therapy  Foods that reduce pain: 1) Ginger (especially studied for arthritis)- reduce leukotriene production to decrease inflammation 2) Blueberries- high in phytonutrients that decrease inflammation 3) Salmon- marine omega-3s reduce joint swelling and pain 4) Pumpkin seeds- reduce inflammation 5) dark chocolate- reduces inflammation 6) turmeric- reduces inflammation 7) tart cherries - reduce pain and stiffness 8) extra virgin olive oil - its compound olecanthal helps to block prostaglandins  9) chili peppers- can be eaten or applied topically via capsaicin 10) mint- helpful for headache, muscle aches, joint pain, and itching 11) garlic- reduces inflammation  Link to further information on diet for chronic pain: http://www.bray.com/  Turmeric to reduce inflammation--can be used in cooking or taken as a supplement.  Benefits of turmeric:  -Highly anti-inflammatory  -Increases antioxidants  -Improves memory, attention, brain disease  -Lowers risk of heart disease  -May help prevent cancer  -Decreases pain  -Alleviates depression  -Delays aging and decreases risk of chronic disease  -Consume with black pepper to increase absorption

## 2022-12-23 NOTE — Progress Notes (Signed)
Subjective:    Patient ID: Mia Thomas, female    DOB: [birth year], 52 y.o.   MRN: [id number]  HPI Mia Thomas is a 52 year old woman who presents with joint pain.  1) Joint pain: -she has failed naproxen- they give her reflux -she gets constipated with opioids -patient has not yet tried Amitriptyline, Cymbalta, Gabapentin, Nortriptyline, Lyrica, Effexor- patient did not want to try and of these options due to risk of weight gain -patient has failed the following:  lidocaine patches, topical capsaicin, diclofenac gel, tiger balm -she prefers ess medicine -she has found hot tub, cold tub -5 years ago she noticed that her left hip was higher than her right hip -she went to Dr Mia Thomas as she had tore her ITB- she was recommended to have 10 week of bed rest and then exercises- she complied with this -int the last year she noticed that the left shoulder was higher -in the midst of this she started getting weird rashes and lupus was found in the rash -she saw Dr. Corliss Thomas and was told her bloodwork looks amazing.  2) OA of neck, low back, and hands -she was found to have of neck, low back, and hands -no evidence of psoriatic and rheumatoid arthritis -she walks 5-7 miles per day, after mile 4 her left hip starts clicking and she gets a charl  Pain Inventory Average Pain 4 Pain Right Now 4 My pain is constant, burning, and aching  In the last 24 hours, has pain interfered with the following? General activity 4 Relation with others 4 Enjoyment of life 6 What TIME of day is your pain at its worst? morning  Sleep (in general) Fair  Pain is worse with: some activites Pain improves with: heat/ice Relief from Meds: 5  walk without assistance how many minutes can you walk? 90 ability to climb steps?  yes do you drive?  yes  retired  numbness tingling  Any changes since last visit?  no  Any changes since last visit?  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   Originally from [place], and moved to [place] from [place] in 2016.  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 She is considering what else she will do with her time as her children are much less dependent on her.   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    BP 117/76   Pulse 68   Ht 5' 5.5" (1.664 m)   Wt 214 lb (97.1 kg)   SpO2 95%   BMI 35.07 kg/m   Opioid Risk Score:   Fall Risk Score:  `1  Depression screen [facility] 2/9     12/23/2022   12:56 PM 06/06/2021   10:59 AM 02/26/2018    9:17 AM 11/27/2017    2:44 PM 10/09/2016    1:49 PM 04/29/2016   10:28 AM 01/08/2016    1:50 PM  Depression screen PHQ 2/9  Decreased Interest 0 3 0 0 0 0 0  Down, Depressed, Hopeless 0 3 0 0 0 0 0  PHQ - 2 Score 0 6 0 0 0 0 0  Altered sleeping 2 3       Tired, decreased energy 0 3       Change in appetite 0 3       Feeling bad or failure about yourself  0 3       Trouble concentrating 0 3       Moving slowly or fidgety/restless 0 0       Suicidal thoughts 0 0       PHQ-9 Score 2 21       Difficult doing work/chores Not difficult at all            Review of Systems  Musculoskeletal:  Positive for back pain and neck pain.       Left shoulder pain Left hip pain   All other systems reviewed and are negative.     Objective:   Physical Exam Gen: no distress, normal appearing HEENT: oral mucosa pink and moist, NCAT Cardio: Reg rate Chest: normal effort, normal rate of breathing Abd: soft, non-distended Ext: no edema Psych: pleasant, normal affect Skin: intact Neuro/MSK: 5/5 strength in bilateral upper extremities, Trendelenburg gait      Assessment & Plan:   1) cervical myofascial pain: -discussed plan for trigger point injections next visit -discussed benefits of massage -cervical traction device ordered  2) Hip pain  -PT prescribed  -  discussed that glut med and  glut min are likely weak and contributing to trendelenburg gait  3) Mixed connective tissue disorder -husband discussed that she gets rashes, joint stiffness in the morning -discussed that 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 Discussed that this is a capsaicin patch, stronger than capsaicin cream. Discussed that it is currently approved for diabetic peripheral neuropathy and post-herpetic neuralgia, but that it has also shown benefit in treating other forms of neuropathy. Provided patient with link to site to learn more about the patch: https://www.clark.biz/. Discussed that the patch would be placed in office and benefits usually last 3 months. Discussed that unintended exposure to capsaicin can cause severe irritation of eyes, mucous membranes, respiratory tract, and skin, but that Qutenza is a local treatment and does not have the systemic side effects of other nerve medications. Discussed that there may be pain, itching, erythema, and decreased sensory function associated with the application of Qutenza. Side effects usually subside within 1 week. A cold pack of analgesic medications can help with these side effects. Blood pressure can also be increased due to pain associated with administration of the patch.

## 2022-12-25 ENCOUNTER — Ambulatory Visit: Payer: 59 | Attending: Rheumatology | Admitting: Rheumatology

## 2022-12-25 ENCOUNTER — Encounter: Payer: Self-pay | Admitting: Rheumatology

## 2022-12-25 VITALS — BP 121/79 | HR 63 | Ht 66.0 in | Wt 214.0 lb

## 2022-12-25 DIAGNOSIS — M79672 Pain in left foot: Secondary | ICD-10-CM

## 2022-12-25 DIAGNOSIS — R5383 Other fatigue: Secondary | ICD-10-CM

## 2022-12-25 DIAGNOSIS — M25552 Pain in left hip: Secondary | ICD-10-CM

## 2022-12-25 DIAGNOSIS — M2550 Pain in unspecified joint: Secondary | ICD-10-CM | POA: Diagnosis not present

## 2022-12-25 DIAGNOSIS — M791 Myalgia, unspecified site: Secondary | ICD-10-CM

## 2022-12-25 DIAGNOSIS — M79641 Pain in right hand: Secondary | ICD-10-CM

## 2022-12-25 DIAGNOSIS — I7300 Raynaud's syndrome without gangrene: Secondary | ICD-10-CM

## 2022-12-25 DIAGNOSIS — K1379 Other lesions of oral mucosa: Secondary | ICD-10-CM | POA: Diagnosis not present

## 2022-12-25 DIAGNOSIS — G8929 Other chronic pain: Secondary | ICD-10-CM

## 2022-12-25 DIAGNOSIS — Z8269 Family history of other diseases of the musculoskeletal system and connective tissue: Secondary | ICD-10-CM

## 2022-12-25 DIAGNOSIS — M5030 Other cervical disc degeneration, unspecified cervical region: Secondary | ICD-10-CM

## 2022-12-25 DIAGNOSIS — M79642 Pain in left hand: Secondary | ICD-10-CM

## 2022-12-25 DIAGNOSIS — M533 Sacrococcygeal disorders, not elsewhere classified: Secondary | ICD-10-CM

## 2022-12-25 DIAGNOSIS — R21 Rash and other nonspecific skin eruption: Secondary | ICD-10-CM | POA: Diagnosis not present

## 2022-12-25 DIAGNOSIS — L930 Discoid lupus erythematosus: Secondary | ICD-10-CM

## 2022-12-25 DIAGNOSIS — M25562 Pain in left knee: Secondary | ICD-10-CM

## 2022-12-25 DIAGNOSIS — M545 Low back pain, unspecified: Secondary | ICD-10-CM

## 2022-12-25 DIAGNOSIS — E559 Vitamin D deficiency, unspecified: Secondary | ICD-10-CM

## 2022-12-25 DIAGNOSIS — M25551 Pain in right hip: Secondary | ICD-10-CM | POA: Diagnosis not present

## 2022-12-25 DIAGNOSIS — M25561 Pain in right knee: Secondary | ICD-10-CM

## 2022-12-25 DIAGNOSIS — Z79899 Other long term (current) drug therapy: Secondary | ICD-10-CM

## 2022-12-25 DIAGNOSIS — M7062 Trochanteric bursitis, left hip: Secondary | ICD-10-CM

## 2022-12-25 DIAGNOSIS — M79671 Pain in right foot: Secondary | ICD-10-CM | POA: Diagnosis not present

## 2022-12-25 NOTE — Addendum Note (Signed)
Addended by: Ellen Henri on: 12/25/2022 02:44 PM   Modules accepted: Orders

## 2022-12-25 NOTE — Patient Instructions (Addendum)
Cervical Strain and Sprain Rehab Ask your health care provider which exercises are safe for you. Do exercises exactly as told by your health care provider and adjust them as directed. It is normal to feel mild stretching, pulling, tightness, or discomfort as you do these exercises. Stop right away if you feel sudden pain or your pain gets worse. Do not begin these exercises until told by your health care provider. Stretching and range-of-motion exercises Cervical side bending  Using good posture, sit on a stable chair or stand up. Without moving your shoulders, slowly tilt your left / right ear to your shoulder until you feel a stretch in the neck muscles on the opposite side. You should be looking straight ahead. Hold for __________ seconds. Repeat with the other side of your neck. Repeat __________ times. Complete this exercise __________ times a day. Cervical rotation  Using good posture, sit on a stable chair or stand up. Slowly turn your head to the side as if you are looking over your left / right shoulder. Keep your eyes level with the ground. Stop when you feel a stretch along the side and the back of your neck. Hold for __________ seconds. Repeat this by turning to your other side. Repeat __________ times. Complete this exercise __________ times a day. Thoracic extension and pectoral stretch  Roll a towel or a small blanket so it is about 4 inches (10 cm) in diameter. Lie down on your back on a firm surface. Put the towel in the middle of your back across your spine. It should not be under your shoulder blades. Put your hands behind your head and let your elbows fall out to your sides. Hold for __________ seconds. Repeat __________ times. Complete this exercise __________ times a day. Strengthening exercises Upper cervical flexion  Lie on your back with a thin pillow behind your head or a small, rolled-up towel under your neck. Gently tuck your chin toward your chest and nod  your head down to look toward your feet. Do not lift your head off the pillow. Hold for __________ seconds. Release the tension slowly. Relax your neck muscles completely before you repeat this exercise. Repeat __________ times. Complete this exercise __________ times a day. Cervical extension  Stand about 6 inches (15 cm) away from a wall, with your back facing the wall. Place a soft object, about 6-8 inches (15-20 cm) in diameter, between the back of your head and the wall. A soft object could be a small pillow, a ball, or a folded towel. Gently tilt your head back and press into the soft object. Keep your jaw and forehead relaxed. Hold for __________ seconds. Release the tension slowly. Relax your neck muscles completely before you repeat this exercise. Repeat __________ times. Complete this exercise __________ times a day. Posture and body mechanics Body mechanics refer to the movements and positions of your body while you do your daily activities. Posture is part of body mechanics. Good posture and healthy body mechanics can help to relieve stress in your body's tissues and joints. Good posture means that your spine is in its natural S-curve position (your spine is neutral), your shoulders are pulled back slightly, and your head is not tipped forward. The following are general guidelines for using improved posture and body mechanics in your everyday activities. Sitting  When sitting, keep your spine neutral and keep your feet flat on the floor. Use a footrest, if needed, and keep your thighs parallel to the floor. Avoid rounding  your shoulders. Avoid tilting your head forward. When working at a desk or a computer, keep your desk at a height where your hands are slightly lower than your elbows. Slide your chair under your desk so you are close enough to maintain good posture. When working at a computer, place your monitor at a height where you are looking straight ahead and you do not have to  tilt your head forward or downward to look at the screen. Standing  When standing, keep your spine neutral and keep your feet about hip-width apart. Keep a slight bend in your knees. Your ears, shoulders, and hips should line up. When you do a task in which you stand in one place for a long time, place one foot up on a stable object that is 2-4 inches (5-10 cm) high, such as a footstool. This helps keep your spine neutral. Resting When lying down and resting, avoid positions that are most painful for you. Try to support your neck in a neutral position. You can use a contour pillow or a small rolled-up towel. Your pillow should support your neck but not push on it. This information is not intended to replace advice given to you by your health care provider. Make sure you discuss any questions you have with your health care provider. Document Revised: 06/24/2022 Document Reviewed: 09/10/2021 [employer] Patient Education  2024 [employer] Low Back Sprain or Strain Rehab Ask your health care provider which exercises are safe for you. Do exercises exactly as told by your health care provider and adjust them as directed. It is normal to feel mild stretching, pulling, tightness, or discomfort as you do these exercises. Stop right away if you feel sudden pain or your pain gets worse. Do not begin these exercises until told by your health care provider. Stretching and range-of-motion exercises These exercises warm up your muscles and joints and improve the movement and flexibility of your back. These exercises also help to relieve pain, numbness, and tingling. Lumbar rotation  Lie on your back on a firm bed or the floor with your knees bent. Straighten your arms out to your sides so each arm forms a 90-degree angle (right angle) with a side of your body. Slowly move (rotate) both of your knees to one side of your body until you feel a stretch in your lower back (lumbar). Try not to let your shoulders lift  off the floor. Hold this position for __________ seconds. Tense your abdominal muscles and slowly move your knees back to the starting position. Repeat this exercise on the other side of your body. Repeat __________ times. Complete this exercise __________ times a day. Single knee to chest  Lie on your back on a firm bed or the floor with both legs straight. Bend one of your knees. Use your hands to move your knee up toward your chest until you feel a gentle stretch in your lower back and buttock. Hold your leg in this position by holding on to the front of your knee. Keep your other leg as straight as possible. Hold this position for __________ seconds. Slowly return to the starting position. Repeat with your other leg. Repeat __________ times. Complete this exercise __________ times a day. Prone extension on elbows  Lie on your abdomen on a firm bed or the floor (prone position). Prop yourself up on your elbows. Use your arms to help lift your chest up until you feel a gentle stretch in your abdomen and your lower back.  This will place some of your body weight on your elbows. If this is uncomfortable, try stacking pillows under your chest. Your hips should stay down, against the surface that you are lying on. Keep your hip and back muscles relaxed. Hold this position for __________ seconds. Slowly relax your upper body and return to the starting position. Repeat __________ times. Complete this exercise __________ times a day. Strengthening exercises These exercises build strength and endurance in your back. Endurance is the ability to use your muscles for a long time, even after they get tired. Pelvic tilt This exercise strengthens the muscles that lie deep in the abdomen. Lie on your back on a firm bed or the floor with your legs extended. Bend your knees so they are pointing toward the ceiling and your feet are flat on the floor. Tighten your lower abdominal muscles to press your  lower back against the floor. This motion will tilt your pelvis so your tailbone points up toward the ceiling instead of pointing to your feet or the floor. To help with this exercise, you may place a small towel under your lower back and try to push your back into the towel. Hold this position for __________ seconds. Let your muscles relax completely before you repeat this exercise. Repeat __________ times. Complete this exercise __________ times a day. Alternating arm and leg raises  Get on your hands and knees on a firm surface. If you are on a hard floor, you may want to use padding, such as an exercise mat, to cushion your knees. Line up your arms and legs. Your hands should be directly below your shoulders, and your knees should be directly below your hips. Lift your left leg behind you. At the same time, raise your right arm and straighten it in front of you. Do not lift your leg higher than your hip. Do not lift your arm higher than your shoulder. Keep your abdominal and back muscles tight. Keep your hips facing the ground. Do not arch your back. Keep your balance carefully, and do not hold your breath. Hold this position for __________ seconds. Slowly return to the starting position. Repeat with your right leg and your left arm. Repeat __________ times. Complete this exercise __________ times a day. Abdominal set with straight leg raise  Lie on your back on a firm bed or the floor. Bend one of your knees and keep your other leg straight. Tense your abdominal muscles and lift your straight leg up, 4-6 inches (10-15 cm) off the ground. Keep your abdominal muscles tight and hold this position for __________ seconds. Do not hold your breath. Do not arch your back. Keep it flat against the ground. Keep your abdominal muscles tense as you slowly lower your leg back to the starting position. Repeat with your other leg. Repeat __________ times. Complete this exercise __________ times a  day. Single leg lower with bent knees Lie on your back on a firm bed or the floor. Tense your abdominal muscles and lift your feet off the floor, one foot at a time, so your knees and hips are bent in 90-degree angles (right angles). Your knees should be over your hips and your lower legs should be parallel to the floor. Keeping your abdominal muscles tense and your knee bent, slowly lower one of your legs so your toe touches the ground. Lift your leg back up to return to the starting position. Do not hold your breath. Do not let your back arch. Keep  your back flat against the ground. Repeat with your other leg. Repeat __________ times. Complete this exercise __________ times a day. Posture and body mechanics Good posture and healthy body mechanics can help to relieve stress in your body's tissues and joints. Body mechanics refers to the movements and positions of your body while you do your daily activities. Posture is part of body mechanics. Good posture means: Your spine is in its natural S-curve position (neutral). Your shoulders are pulled back slightly. Your head is not tipped forward (neutral). Follow these guidelines to improve your posture and body mechanics in your everyday activities. Standing  When standing, keep your spine neutral and your feet about hip-width apart. Keep a slight bend in your knees. Your ears, shoulders, and hips should line up. When you do a task in which you stand in one place for a long time, place one foot up on a stable object that is 2-4 inches (5-10 cm) high, such as a footstool. This helps keep your spine neutral. Sitting  When sitting, keep your spine neutral and keep your feet flat on the floor. Use a footrest, if necessary, and keep your thighs parallel to the floor. Avoid rounding your shoulders, and avoid tilting your head forward. When working at a desk or a computer, keep your desk at a height where your hands are slightly lower than your elbows.  Slide your chair under your desk so you are close enough to maintain good posture. When working at a computer, place your monitor at a height where you are looking straight ahead and you do not have to tilt your head forward or downward to look at the screen. Resting When lying down and resting, avoid positions that are most painful for you. If you have pain with activities such as sitting, bending, stooping, or squatting, lie in a position in which your body does not bend very much. For example, avoid curling up on your side with your arms and knees near your chest (fetal position). If you have pain with activities such as standing for a long time or reaching with your arms, lie with your spine in a neutral position and bend your knees slightly. Try the following positions: Lying on your side with a pillow between your knees. Lying on your back with a pillow under your knees. Lifting  When lifting objects, keep your feet at least shoulder-width apart and tighten your abdominal muscles. Bend your knees and hips and keep your spine neutral. It is important to lift using the strength of your legs, not your back. Do not lock your knees straight out. Always ask for help to lift heavy or awkward objects. This information is not intended to replace advice given to you by your health care provider. Make sure you discuss any questions you have with your health care provider. Document Revised: 06/24/2022 Document Reviewed: 05/08/2020 [employer] Patient Education  2024 [employer] Hand Exercises Hand exercises can be helpful for almost anyone. They can strengthen your hands and improve flexibility and movement. The exercises can also increase blood flow to the hands. These results can make your work and daily tasks easier for you. Hand exercises can be especially helpful for people who have joint pain from arthritis or nerve damage from using their hands over and over. These exercises can also help people  who injure a hand. Exercises Most of these hand exercises are gentle stretching and motion exercises. It is usually safe to do them often throughout the day. Warming  up your hands before exercise may help reduce stiffness. You can do this with gentle massage or by placing your hands in warm water for 10-15 minutes. It is normal to feel some stretching, pulling, tightness, or mild discomfort when you begin new exercises. In time, this will improve. Remember to always be careful and stop right away if you feel sudden, very bad pain or your pain gets worse. You want to get better and be safe. Ask your health care provider which exercises are safe for you. Do exercises exactly as told by your provider and adjust them as told. Do not begin these exercises until told by your provider. Knuckle bend or "claw" fist  Stand or sit with your arm, hand, and all five fingers pointed straight up. Make sure to keep your wrist straight. Gently bend your fingers down toward your palm until the tips of your fingers are touching your palm. Keep your big knuckle straight and only bend the small knuckles in your fingers. Hold this position for 10 seconds. Straighten your fingers back to your starting position. Repeat this exercise 5-10 times with each hand. Full finger fist  Stand or sit with your arm, hand, and all five fingers pointed straight up. Make sure to keep your wrist straight. Gently bend your fingers into your palm until the tips of your fingers are touching the middle of your palm. Hold this position for 10 seconds. Extend your fingers back to your starting position, stretching every joint fully. Repeat this exercise 5-10 times with each hand. Straight fist  Stand or sit with your arm, hand, and all five fingers pointed straight up. Make sure to keep your wrist straight. Gently bend your fingers at the big knuckle, where your fingers meet your hand, and at the middle knuckle. Keep the knuckle at the  tips of your fingers straight and try to touch the bottom of your palm. Hold this position for 10 seconds. Extend your fingers back to your starting position, stretching every joint fully. Repeat this exercise 5-10 times with each hand. Tabletop  Stand or sit with your arm, hand, and all five fingers pointed straight up. Make sure to keep your wrist straight. Gently bend your fingers at the big knuckle, where your fingers meet your hand, as far down as you can. Keep the small knuckles in your fingers straight. Think of forming a tabletop with your fingers. Hold this position for 10 seconds. Extend your fingers back to your starting position, stretching every joint fully. Repeat this exercise 5-10 times with each hand. Finger spread  Place your hand flat on a table with your palm facing down. Make sure your wrist stays straight. Spread your fingers and thumb apart from each other as far as you can until you feel a gentle stretch. Hold this position for 10 seconds. Bring your fingers and thumb tight together again. Hold this position for 10 seconds. Repeat this exercise 5-10 times with each hand. Making circles  Stand or sit with your arm, hand, and all five fingers pointed straight up. Make sure to keep your wrist straight. Make a circle by touching the tip of your thumb to the tip of your index finger. Hold for 10 seconds. Then open your hand wide. Repeat this motion with your thumb and each of your fingers. Repeat this exercise 5-10 times with each hand. Thumb motion  Sit with your forearm resting on a table and your wrist straight. Your thumb should be facing up toward the ceiling.  Keep your fingers relaxed as you move your thumb. Lift your thumb up as high as you can toward the ceiling. Hold for 10 seconds. Bend your thumb across your palm as far as you can, reaching the tip of your thumb for the small finger (pinkie) side of your palm. Hold for 10 seconds. Repeat this exercise 5-10  times with each hand. Grip strengthening  Hold a stress ball or other soft ball in the middle of your hand. Slowly increase the pressure, squeezing the ball as much as you can without causing pain. Think of bringing the tips of your fingers into the middle of your palm. All of your finger joints should bend when doing this exercise. Hold your squeeze for 10 seconds, then relax. Repeat this exercise 5-10 times with each hand. Contact a health care provider if: Your hand pain or discomfort gets much worse when you do an exercise. Your hand pain or discomfort does not improve within 2 hours after you exercise. If you have either of these problems, stop doing these exercises right away. Do not do them again unless your provider says that you can. Get help right away if: You develop sudden, severe hand pain or swelling. If this happens, stop doing these exercises right away. Do not do them again unless your provider says that you can. This information is not intended to replace advice given to you by your health care provider. Make sure you discuss any questions you have with your health care provider. Document Revised: 03/05/2022 Document Reviewed: 03/05/2022 [employer] Patient Education  2024 [employer].

## 2022-12-28 LAB — ALLERGENS (22) FOODS IGG
Casein IgG: 5.3 ug/mL — ABNORMAL HIGH (ref 0.0–1.9)
Cheese, Mold Type IgG: 13.8 ug/mL — ABNORMAL HIGH (ref 0.0–1.9)
Chicken IgG: <2 ug/mL (ref 0.0–1.9)
Chocolate/Cacao IgG: 3.3 ug/mL — ABNORMAL HIGH (ref 0.0–1.9)
Coffee IgG: 4.8 ug/mL — ABNORMAL HIGH (ref 0.0–1.9)
Corn IgG: 3 ug/mL — ABNORMAL HIGH (ref 0.0–1.9)
Egg, Whole IgG: 2.6 ug/mL — ABNORMAL HIGH (ref 0.0–1.9)
Green Bean IgG: 5.1 ug/mL — ABNORMAL HIGH (ref 0.0–1.9)
Haddock IgG: 2.3 ug/mL — ABNORMAL HIGH (ref 0.0–1.9)
Lamb IgG: 3.3 ug/mL — ABNORMAL HIGH (ref 0.0–1.9)
Oat IgG: 5.6 ug/mL — ABNORMAL HIGH (ref 0.0–1.9)
Onion IgG: 4.5 ug/mL — ABNORMAL HIGH (ref 0.0–1.9)
Peanut IgG: 3.2 ug/mL — ABNORMAL HIGH (ref 0.0–1.9)
Pork IgG: 4.3 ug/mL — ABNORMAL HIGH (ref 0.0–1.9)
Potato, White, IgG: 2.6 ug/mL — ABNORMAL HIGH (ref 0.0–1.9)
Rye IgG: 5 ug/mL — ABNORMAL HIGH (ref 0.0–1.9)
Shrimp IgG: 2.9 ug/mL — ABNORMAL HIGH (ref 0.0–1.9)
Soybean IgG: <2 ug/mL (ref 0.0–1.9)
Tomato IgG: 5.5 ug/mL — ABNORMAL HIGH (ref 0.0–1.9)
Wheat IgG: 4 ug/mL — ABNORMAL HIGH (ref 0.0–1.9)
Yeast IgG: 3.6 ug/mL — ABNORMAL HIGH (ref 0.0–1.9)

## 2022-12-31 ENCOUNTER — Encounter (HOSPITAL_BASED_OUTPATIENT_CLINIC_OR_DEPARTMENT_OTHER): Payer: 59 | Admitting: Physical Medicine and Rehabilitation

## 2022-12-31 DIAGNOSIS — M2550 Pain in unspecified joint: Secondary | ICD-10-CM

## 2023-01-01 NOTE — Progress Notes (Signed)
Subjective:    Patient ID: Mia Thomas, female    DOB: 1970/06/18, 52 y.o.   MRN: [id number]  HPI Mia Thomas is a 52 year old woman who presents with joint pain.  1) Joint pain: -she has failed naproxen- they give her reflux -she gets constipated with opioids -patient has not yet tried Amitriptyline, Cymbalta, Gabapentin, Nortriptyline, Lyrica, Effexor- patient did not want to try and of these options due to risk of weight gain -patient has failed the following:  lidocaine patches, topical capsaicin, diclofenac gel, tiger balm -she prefers ess medicine -she has found hot tub, cold tub -5 years ago she noticed that her left hip was higher than her right hip -she went to Mia Thomas as she had tore her ITB- she was recommended to have 10 week of bed rest and then exercises- she complied with this -int the last year she noticed that the left shoulder was higher -in the midst of this she started getting weird rashes and lupus was found in the rash -she saw Mia Thomas and was told her bloodwork looks amazing. Denies IBS -feels that she is allergic to everything  2) OA of neck, low back, and hands -she was found to have of neck, low back, and hands -no evidence of psoriatic and rheumatoid arthritis -she walks 5-7 miles per day, after mile 4 her left hip starts clicking and she gets a charl  Pain Inventory Average Pain 4 Pain Right Now 4 My pain is constant, burning, and aching  In the last 24 hours, has pain interfered with the following? General activity 4 Relation with others 4 Enjoyment of life 6 What TIME of day is your pain at its worst? morning  Sleep (in general) Fair  Pain is worse with: some activites Pain improves with: heat/ice Relief from Meds: 5  walk without assistance how many minutes can you walk? 90 ability to climb steps?  yes do you drive?  yes  retired  numbness tingling  Any changes since last visit?  no  Any changes since last visit?   no    Family History  Problem Relation Age of Onset   Cancer Mother        skin   Atrial fibrillation Mother    Cancer Father        skin   Heart disease Father    Asthma Father    Rheum arthritis Father    Bladder Cancer Father    Prostate cancer Father    Other Father        shingles   Cancer Sister        cluster cancers   Hypertension Sister    Glaucoma Sister    Hypertension Brother    Rheum arthritis Paternal Aunt    Rheum arthritis Paternal Aunt    Rheum arthritis Paternal Uncle    Healthy Daughter    Healthy Son    Rheum arthritis Cousin    Colon cancer Neg Hx    Esophageal cancer Neg Hx    Rectal cancer Neg Hx    Stomach cancer Neg Hx    Social History   Socioeconomic History   Marital status: Married    Spouse name: Mia Thomas   Number of children: 3   Years of education: Not on file   Highest education level: Not on file  Occupational History   Occupation: Education officer, 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   Originally from [place], and moved to [place] from [place] in 2016.   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 She is considering what else she will do with her time as her children are much less dependent on her.   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   Social Determinants of Health   Financial Resource Strain: Not on file  Food Insecurity: Not on file  Transportation Needs: Not on file  Physical Activity: Not on file  Stress: Not on file  Social Connections: Not on file   Past Surgical History:  Procedure Laterality Date   CESAREAN SECTION     CHOLECYSTECTOMY     CHOLECYSTECTOMY     EYE SURGERY     repair corneal tare   knee arthroscopy x 2     Left x 2; right x 1   WISDOM TOOTH EXTRACTION     Past Medical History:  Diagnosis Date   Allergy    Anxiety    Asthma    Depression    Scoliosis    Shingles    Stevens-Johnson syndrome (HCC)    as a child    There were no vitals taken for this visit.  Opioid Risk Score:   Fall Risk Score:  `1  Depression screen [facility] 2/9     12/23/2022   12:56 PM 06/06/2021   10:59 AM 02/26/2018    9:17 AM 11/27/2017    2:44 PM 10/09/2016    1:49 PM 04/29/2016   10:28 AM 01/08/2016    1:50 PM  Depression screen PHQ 2/9  Decreased Interest 0 3 0 0 0 0 0  Down, Depressed, Hopeless 0 3 0 0 0 0 0  PHQ - 2 Score 0 6 0 0 0 0 0  Altered sleeping 2 3       Tired, decreased energy 0 3       Change in appetite 0 3       Feeling bad or failure about yourself  0 3       Trouble concentrating 0 3       Moving slowly or fidgety/restless 0 0       Suicidal thoughts 0 0       PHQ-9 Score 2 21       Difficult doing work/chores Not difficult at all            Review of Systems  Musculoskeletal:  Positive for back pain and neck pain.       Left shoulder pain Left hip pain   All other systems reviewed and are negative.  Objective:   Physical Exam PRIOR exam Gen: no distress, normal appearing HEENT: oral mucosa pink and moist, NCAT Cardio: Reg rate Chest: normal effort, normal  rate of breathing Abd: soft, non-distended Ext: no edema Psych: pleasant, normal affect Skin: intact Neuro/MSK: 5/5 strength in bilateral upper extremities, Trendelenburg gait      Assessment & Plan:   1) cervical myofascial pain: -discussed plan for trigger point injections next visit -discussed benefits of massage -cervical traction device ordered  2) Hip pain  -PT prescribed  -discussed that glut med and glut min are likely weak and contributing to trendelenburg gait  3) Mixed connective tissue disorder -husband discussed that she gets rashes, joint stiffness in the morning -discussed that when she was 7 she had Viviann Spare Johnson's Syndrome- she was treated with a lot of anitbiotics -continue estrogen -food allergy testing ordered, discussed results, discussed that highest antibodies were in response to cheese mold and advised following a 3 month elimination diet of cheese and seeing if symptoms improv  4) Facet arthropathy -Discussed Qutenza as an option for neuropathic pain control. Discussed that this is a capsaicin patch, stronger than capsaicin cream. Discussed that it is currently approved for diabetic peripheral neuropathy and post-herpetic neuralgia, but that it has also shown benefit in treating other forms of neuropathy. Provided patient with link to site to learn more about the patch: https://www.clark.biz/. Discussed that the patch would be placed in office and benefits usually last 3 months. Discussed that unintended exposure to capsaicin can cause severe irritation of eyes, mucous membranes, respiratory tract, and skin, but that Qutenza is a local treatment and does not have the systemic side effects of other nerve medications. Discussed that there may be pain, itching, erythema, and decreased sensory function associated with the application of Qutenza. Side effects usually subside within 1 week. A cold pack of analgesic medications can help with these side effects. Blood  pressure can also be increased due to pain associated with administration of the patch.   6 minutes spent in discussion of food sensitivity results, discussed that highest IgG antibodies were produced in response to cheese and advised following a 3 month elimination diet of cheese and seeing if symptoms improve

## 2023-01-02 ENCOUNTER — Other Ambulatory Visit (HOSPITAL_COMMUNITY): Payer: Self-pay

## 2023-01-02 ENCOUNTER — Ambulatory Visit: Payer: 59 | Admitting: Physical Therapy

## 2023-01-02 ENCOUNTER — Other Ambulatory Visit: Payer: Self-pay

## 2023-01-02 ENCOUNTER — Encounter: Payer: 59 | Admitting: Rehabilitative and Restorative Service Providers"

## 2023-01-03 NOTE — Therapy (Signed)
OUTPATIENT OCCUPATIONAL THERAPY ORTHO EVALUATION  Patient Name: Mia Thomas MRN: [id number] DOB:June 21, 1970, 52 y.o., female Today's Date: 01/08/2023  PCP: Angela Adam, NP REFERRING PROVIDER: Pollyann Savoy, MD   END OF SESSION:  OT End of Session - 01/08/23 0909     Visit Number 1    Number of Visits 6    Date for OT Re-Evaluation 02/07/23    Authorization Type [employer]    OT Start Time [phone]    OT Stop Time 1025    OT Time Calculation (min) 51 min    Activity Tolerance Patient tolerated treatment well;No increased pain;Patient limited by fatigue;Patient limited by pain    Behavior During Therapy [facility] for tasks assessed/performed             Past Medical History:  Diagnosis Date   Allergy    Anxiety    Asthma    Depression    Scoliosis    Shingles    Stevens-Johnson syndrome (HCC)    as a child    Past Surgical History:  Procedure Laterality Date   CESAREAN SECTION     CHOLECYSTECTOMY     CHOLECYSTECTOMY     EYE SURGERY     repair corneal tare   knee arthroscopy x 2     Left x 2; right x 1   WISDOM TOOTH EXTRACTION     Patient Active Problem List   Diagnosis Date Noted   Joint swelling 05/29/2022   SI (sacroiliac) joint dysfunction 12/19/2020   Somatic dysfunction of spine, sacral 12/19/2020   Pain of left hip joint 02/07/2020   Panic anxiety syndrome 10/16/2016   Perimenopausal symptoms 10/16/2016   Depression 04/28/2016   Anxiety 04/28/2016   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   Allergic rhinitis 01/08/2016    ONSET DATE: chronic, insidious onset   REFERRING DIAG: M79.641,M79.642 (ICD-10-CM) - Pain in both hands   THERAPY DIAG:  Pain in left hand - Plan: Ot plan of care cert/re-cert  Pain in right hand - Plan: Ot plan of care cert/re-cert  Stiffness of left hand, not elsewhere classified - Plan: Ot plan of care cert/re-cert  Stiffness of right hand, not elsewhere classified - Plan: Ot plan of care cert/re-cert  Muscle weakness  (generalized) - Plan: Ot plan of care cert/re-cert  Pain in right elbow - Plan: Ot plan of care cert/re-cert  Pain in left elbow - Plan: Ot plan of care cert/re-cert  Rationale for Evaluation and Treatment: Rehabilitation  SUBJECTIVE:   SUBJECTIVE STATEMENT: She goes by [place], states she is not effected much by her Lupus, but she's been having some joint stiffness and pain, especially in L hand SF DIP J. She wants to learn strategies for long-term management of her issues. She also c/o "tennis elbow" in bil arms for the past 6 months.  That issue is mildly aggravating for her as well.  She does a lot of cooking, cleaning, helps her mother transfer, etc.    PERTINENT HISTORY: Per MD notes: She has pmhx of Lupus erythematosus tumidus, Raynauds, X-rays showed right fifth DIP joint severe narrowing and erosive changes. X-rays were reviewed with the patient. Her husband noted dactylitis in the past   PRECAUTIONS: None  RED FLAGS: None   WEIGHT BEARING RESTRICTIONS: No  PAIN:  Are you having pain? Yes: NPRS scale: 0.5/10 at rest in bil UEs and at worst in the past week, up to 4-5 /10 Pain location: :Lt hand SF DIP J, and hands Pain description: aching at times  Aggravating factors: weight bearing, repetition Relieving factors: rest, heat   FALLS: Has patient fallen in last 6 months? No  PLOF: Independent  PATIENT GOALS: To decrease pain and stiffness in bilateral hands and prevent future deformity    OBJECTIVE: (All objective assessments below are from initial evaluation on: 01/08/23 unless otherwise specified.)    HAND DOMINANCE: Right   ADLs: Overall ADLs: States decreased ability to grab, hold household objects, pain and difficulty to open containers, perform FMS tasks (manipulate fasteners on clothing)   FUNCTIONAL OUTCOME MEASURES: Eval: Quick DASH 16% impairment today  (Higher % Score  =  More Impairment)     UPPER EXTREMITY ROM     Shoulder to Wrist AROM  Right eval Left eval  Forearm supination  89  Forearm pronation   85  Wrist flexion 65 70  Wrist extension 68 63  (Blank rows = not tested)   Hand AROM Left eval  Full Fist Ability (or Gap to Distal Palmar Crease) 2.8cm from tip SF to [facility]  Thumb Opposition  (Kapandji Scale)  10/10  Thumb MCP (0-60)   Thumb IP (0-80)   Thumb Radial Abduction Span    Thumb Palmar Abduction Span    Index MCP (0-90)    Index PIP (0-100)    Index DIP (0-70)    Long MCP (0-90)    Long PIP (0-100)    Long DIP (0-70)    Ring MCP (0-90)    Ring PIP (0-100)    Ring DIP (0-70)    Little MCP (0-90) 0-34   (0-63 in isolation)  Little PIP (0-100)  0-69  Little DIP (0-70) 0- 56  (15* radial angulation)   (Blank rows = not tested)   UPPER EXTREMITY MMT:     MMT Right 01/08/23 Left 01/08/23  Forearm supination    Forearm pronation    Wrist flexion 5/5 4/5 tender  Wrist extension 4-/5 pain 4-/5 pain  Wrist ulnar deviation    Wrist radial deviation    (Blank rows = not tested)  HAND FUNCTION: Eval: Observed weakness in affected Lt hand.  Grip strength Right: 42 lbs, Left: 29 lbs   COORDINATION: Eval: Observed coordination impairments with affected Lt hand.  Details TBD   SENSATION: Eval:  Light touch intact today  COGNITION: Eval: Overall cognitive status: WFL for evaluation today, she does appear somewhat anxious and laughs about having stress and anxiety issues   OBSERVATIONS:   Eval: She has some tenderness to both lateral epicondyles and extensor wad's, left hand small finger DIP joint has a rotational deformity from apparent arthritis but is still somewhat actively and passively flexible though very stiff.  Other DIP joints are starting to stiffen and develop Heberden's nodes as well.  She also has the habit of avoiding using her small finger which is likely creating extrinsic tightness through it.  She has positive test for intrinsic tightness for her other fingers but positive  extrinsic tightness for the small finger.   TODAY'S TREATMENT:  Post-evaluation treatment:   She was given safety/self-care education on preventing pain, avoiding repetitive postures and motions, managing arthritis in both of her hands to reduce wear and tear.  She was given multiple handouts on these issues with explanation from OT and specific functional examples.  She was also recommended to get supportive bracing/gloves for the thumb and fingers to help reduce wear and tear and given examples of these.  Additionally, she was educated on the use of a counterforce/elbow brace for  tennis elbow symptoms and shown how to wear it correctly.  She can also get a compression sleeve which would also be helpful.  She is educated to avoid pronation elbow extension and wrist extension and lift with palm up position.  Lastly, she was given the following home exercise program and it was reviewed with her very briefly due to the time of evaluation today taking quite a while.  It will need reviewed in upcoming sessions, and she was told that if she attempts any of these and they are painful she should stop and wait to see the therapist for more detailed explanation.  She states understanding and thanks the therapist as she leaves.  Exercises - Tricep Stretch- DO SEATED BY TABLE  - 3-4 x daily - 3 reps - 15 hold - Wrist Flexion Stretch  - 4 x daily - 3-5 reps - 15 sec hold - Wrist Prayer Stretch  - 4 x daily - 3-5 reps - 15 sec hold - Standing Radial Nerve Glide  - 4-6 x daily - 10 reps - BACK KNUCKLE STRETCHES   - 4 x daily - 3-5 reps - 15 sec hold - HOOK Stretch  - 4 x daily - 3-5 reps - 15-20 sec hold - Seated Finger Composite Flexion Stretch  - 4 x daily - 3-5 reps - 15 hold - Tendon Glides  - 4-6 x daily - 3-5 reps - 2-3 seconds hold    PATIENT EDUCATION: Education details: See tx section above for details  Person educated: Patient Education method: Verbal Instruction, Teach back, Handouts  Education  comprehension: States and demonstrates understanding, Additional Education required    HOME EXERCISE PROGRAM: Access Code: [email or web address] URL: [email or web address] Date: 01/08/2023 Prepared by: Fannie Knee     GOALS: Goals reviewed with patient? Yes   SHORT TERM GOALS: (STG required if POC>30 days) Target Date: 01/17/23  Pt will obtain protective, custom orthotic. Goal status: TBD/PRN  2.  Pt will demo/state understanding of initial HEP to improve pain levels and prerequisite motion. Goal status: INITIAL   LONG TERM GOALS: Target Date: 02/07/23  Pt will improve functional ability by decreased impairment per Quick DASH assessment from 16% to 10% or better, for better quality of life. Goal status: INITIAL  2.  Pt will improve grip strength in Lt  hand from 29lbs to at least 35lbs for functional use at home and in IADLs. Goal status: INITIAL  3.  Pt will improve A/ROM in Lt SF TAM from 159* to at least 210*, to have functional motion for tasks like reach and grasp.  Goal status: INITIAL  4.  Pt will improve strength in bil wrist ext from 4-/5 MMT to at least 4+/5 MMT to have increased functional ability to carry out selfcare and higher-level homecare tasks with less difficulty. Goal status: INITIAL  5.  Pt will improve coordination skills in Lt hand, as seen by [facility] score on 9HPT testing to have increased functional ability to carry out fine motor tasks (fasteners, etc.) and more complex, coordinated IADLs (meal prep, sports, etc.).  Goal status: INITIAL- TBD initial measures  6.  Pt will decrease pain at worst from 4-5/10 to 2/10 or better to have better sleep and occupational participation in daily roles. Goal status: INITIAL   ASSESSMENT:  CLINICAL IMPRESSION: Patient is a 52 y.o. 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  She states these things decrease her overall function, cause pain, or a hindrance to her ADLs/IADLs.  She will benefit from outpatient occupational therapy to decrease negative symptoms and increase quality of life and learn prevention strategies.Marland Kitchen   PERFORMANCE DEFICITS: in functional skills including ADLs, IADLs, coordination, dexterity, ROM, strength, pain, fascial restrictions, muscle spasms, flexibility, Fine motor control, body mechanics, endurance, decreased knowledge of precautions, and UE functional use, cognitive skills including problem solving and safety awareness, and psychosocial skills including coping strategies, environmental adaptation, and habits.   IMPAIRMENTS: are limiting patient from ADLs, IADLs, work, leisure, and social participation.   COMORBIDITIES: may have co-morbidities  that affects occupational performance. Patient will benefit from skilled OT to address above impairments and improve overall function.  MODIFICATION OR ASSISTANCE TO COMPLETE EVALUATION: No modification of tasks or assist necessary to complete an evaluation.  OT OCCUPATIONAL PROFILE AND HISTORY: Problem focused assessment: Including review of records relating to presenting problem.  CLINICAL DECISION MAKING: Moderate - several treatment options, min-mod task modification necessary  REHAB POTENTIAL: Good  EVALUATION COMPLEXITY: Low      PLAN:  OT FREQUENCY: 1-2x/week  OT DURATION: 4 weeks through 02/07/23 as needed and up to 6 visits   PLANNED INTERVENTIONS: 97168 OT Re-evaluation, 97535 self care/ADL training, 27253 therapeutic exercise, 97530 therapeutic activity, 97140 manual therapy, 97035 ultrasound, 97039 fluidotherapy, 97010 moist heat, 97010 cryotherapy, 97034 contrast bath, 97760 Orthotics management and training, 66440 Splinting (initial encounter), [id number] Subsequent splinting/medication, passive  range of motion, compression bandaging, Dry needling, energy conservation, coping strategies training, patient/family education, and DME and/or AE instructions  RECOMMENDED OTHER SERVICES: getting PT for hip/back issues now   CONSULTED AND AGREED WITH PLAN OF CARE: Patient  PLAN FOR NEXT SESSION:   Go over initial recommendations and HEP thoroughly    Fannie Knee, OTR/L, CHT 01/08/2023, 5:51 PM

## 2023-01-06 ENCOUNTER — Other Ambulatory Visit (HOSPITAL_COMMUNITY): Payer: Self-pay

## 2023-01-07 ENCOUNTER — Ambulatory Visit: Payer: 59 | Admitting: Physical Medicine and Rehabilitation

## 2023-01-08 ENCOUNTER — Encounter: Payer: Self-pay | Admitting: Rehabilitative and Restorative Service Providers"

## 2023-01-08 ENCOUNTER — Encounter: Payer: Self-pay | Admitting: Physical Therapy

## 2023-01-08 ENCOUNTER — Ambulatory Visit: Payer: 59 | Admitting: Physical Therapy

## 2023-01-08 ENCOUNTER — Other Ambulatory Visit: Payer: Self-pay

## 2023-01-08 ENCOUNTER — Ambulatory Visit: Payer: 59 | Admitting: Rehabilitative and Restorative Service Providers"

## 2023-01-08 DIAGNOSIS — M5459 Other low back pain: Secondary | ICD-10-CM

## 2023-01-08 DIAGNOSIS — M6281 Muscle weakness (generalized): Secondary | ICD-10-CM | POA: Diagnosis not present

## 2023-01-08 DIAGNOSIS — M79641 Pain in right hand: Secondary | ICD-10-CM

## 2023-01-08 DIAGNOSIS — M25641 Stiffness of right hand, not elsewhere classified: Secondary | ICD-10-CM

## 2023-01-08 DIAGNOSIS — M25521 Pain in right elbow: Secondary | ICD-10-CM

## 2023-01-08 DIAGNOSIS — M79642 Pain in left hand: Secondary | ICD-10-CM

## 2023-01-08 DIAGNOSIS — M25642 Stiffness of left hand, not elsewhere classified: Secondary | ICD-10-CM

## 2023-01-08 DIAGNOSIS — M25552 Pain in left hip: Secondary | ICD-10-CM | POA: Diagnosis not present

## 2023-01-08 DIAGNOSIS — M25522 Pain in left elbow: Secondary | ICD-10-CM

## 2023-01-08 NOTE — Therapy (Signed)
OUTPATIENT PHYSICAL THERAPY EVALUATION   Patient Name: Mia Thomas MRN: [id number] DOB:[birth year], 52 y.o., female Today's Date: 01/08/2023  END OF SESSION:  PT End of Session - 01/08/23 0940     Visit Number 1    Number of Visits 12    Date for PT Re-Evaluation 03/05/23    PT Start Time 0847    PT Stop Time 0935    PT Time Calculation (min) 48 min    Activity Tolerance Patient tolerated treatment well    Behavior During Therapy WFL for tasks assessed/performed             Past Medical History:  Diagnosis Date   Allergy    Anxiety    Asthma    Depression    Scoliosis    Shingles    Stevens-Johnson syndrome (HCC)    as a child    Past Surgical History:  Procedure Laterality Date   CESAREAN SECTION     CHOLECYSTECTOMY     CHOLECYSTECTOMY     EYE SURGERY     repair corneal tare   knee arthroscopy x 2     Left x 2; right x 1   WISDOM TOOTH EXTRACTION     Patient Active Problem List   Diagnosis Date Noted   Joint swelling 05/29/2022   SI (sacroiliac) joint dysfunction 12/19/2020   Somatic dysfunction of spine, sacral 12/19/2020   Pain of left hip joint 02/07/2020   Panic anxiety syndrome 10/16/2016   Perimenopausal symptoms 10/16/2016   Depression 04/28/2016   Anxiety 04/28/2016   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   Allergic rhinitis 01/08/2016    PCP: Shirline Frees, NP   REFERRING PROVIDER: Horton Chin, MD   REFERRING DIAG:  [phone] (ICD-10-CM) - Facet arthropathy  M76.00 (ICD-10-CM) - Gluteal tendinitis, unspecified laterality    Rationale for Evaluation and Treatment: Rehabilitation  THERAPY DIAG:  Other low back pain  Pain in left hip  Muscle weakness (generalized)  ONSET DATE: Chronic pain since 2020  SUBJECTIVE:                                                                                                                                                                                           SUBJECTIVE STATEMENT: She is here  for hip and and back pain, reports OA. 5 years ago was found to have 30% scoliosis and left hip higher. In 2021 she tripped over dog and fell causing ITB tear. She feels this never fully healed but was told there was no surgery for this. She feels her gait is off and she feels scaping and clicking sometimes when she walks. She  can get sciatica with N/T in left leg sometimes that happens twice a year for 5 days at a time.   PERTINENT HISTORY:  Lupus,Raynauds,Polyarthagia with chronic pain,scoliosis  PAIN:  NPRS scale: 1 currently but can get up to 5/10 upon arrival Pain location:left lateral hip and ITB and low back Pain description: constant low ache but can be sharp Aggravating factors: vacuum, lifting, running so longer even attempts to run Relieving factors: heat   PRECAUTIONS:  diastasis recti  RED FLAGS: None   WEIGHT BEARING RESTRICTIONS: No  FALLS:  Has patient fallen in last 6 months? No  OCCUPATION: retired  PLOF: Independent  PATIENT GOALS: Be able to walk   NEXT MD VISIT: 01/10/23  OBJECTIVE:  Note: Objective measures were completed at Evaluation unless otherwise noted.  DIAGNOSTIC FINDINGS:  Lumbar XR Impression: These findings are suggestive of mild levoscoliosis and L5-S1  narrowing with mild facet joint arthropathy.   Impression: Mild degenerative changes were noted between C5-C6.   PATIENT SURVEYS:  Eval FOTO 66% functional  SCREENING FOR RED FLAGS: No bowel/bladder incontinence  COGNITION: Overall cognitive status: Within functional limits for tasks assessed      POSTURE: scoliosis with left hip higher and Rt leg shorter measured 1/4 inch functional LLD in standing   LUMBAR ROM:   AROM eval  Flexion WNL repated flexion felt better  Extension WNL no change with repeated extension  Right lateral flexion   Left lateral flexion   Right rotation   Left rotation    (Blank rows = not tested)  LOWER EXTREMITY ROM:   WNL gross  assessment    LOWER EXTREMITY MMT:    MMT Right eval Left eval  Hip flexion  5  Hip extension  4+  Hip abduction  4  Hip adduction    Hip internal rotation    Hip external rotation    Knee flexion  5  Knee extension  5  Ankle dorsiflexion  5  Ankle plantarflexion    Ankle inversion    Ankle eversion     (Blank rows = not tested)  LUMBAR SPECIAL TESTS:  Straight leg raise test: Negative and Slump test: Negative  GAIT: Comments:Independent community Ambulator, pain with prolonged walking, left leg longer  TODAY'S TREATMENT:  Eval HEP creation and review with demonstration and trial set preformed, see below for details    PATIENT EDUCATION: Education details: HEP, PT plan of care Person educated: Patient Education method: Explanation, Demonstration, Verbal cues, and Handouts Education comprehension: verbalized understanding and needs further education   HOME EXERCISE PROGRAM: Access Code: [email or web address] URL: [email or web address] Date: 01/08/2023 Prepared by: Ivery Quale  Exercises - Supine ITB Stretch with Strap  - 2 x daily - 6 x weekly - 3 reps - 30 hold - Supine Piriformis Stretch with Foot on Ground (Mirrored)  - 2 x daily - 6 x weekly - 1 sets - 3 reps - 30 hold - Seated Flexion Stretch with Swiss Ball  - 2 x daily - 6 x weekly - 1 sets - 10 reps - 10 sec hold - Seated Thoracic Flexion and Rotation with Swiss Ball (Mirrored)  - 2 x daily - 6 x weekly - 1 sets - 10 reps - 10 sec hold - Figure 4 Bridge  - 2 x daily - 6 x weekly - 1-2 sets - 10 reps - Hooklying Isometric Hip Flexion  - 2 x daily - 6 x weekly - 1-2 sets - 10 reps - 5 sec hold -  Standing Hip Hiking  - 2 x daily - 6 x weekly - 2 sets - 10 reps - Side Stepping with Resistance at Ankles  - 2 x daily - 6 x weekly - 2 sets - 10 reps  Patient Education - [facility]  ASSESSMENT:  CLINICAL IMPRESSION: Patient referred to PT for chronic back pain and left hip pain. Of note she  reports ITB tear in past and feels she never fully recovered from this. Her ITB remains very tight upon assessment today. MD recommending left glute strengthening and core strengthening which was provided today in HEP. She was found to have 1/4 inch LLD (Rt leg shorter) in standing so I did also provide her with heel lift recommendation. Due to her systemic co-morbidities with polyarthralgia I do also recommend trial of aquatic PT with her.  Patient will benefit from skilled PT to address below impairments, limitations and improve overall function.  OBJECTIVE IMPAIRMENTS: decreased activity tolerance, difficulty walking, decreased endurance, decreased mobility, decreased ROM, decreased strength, impaired flexibility, impaired LE use, postural dysfunction, and pain.  ACTIVITY LIMITATIONS: bending, lifting, carry, locomotion, cleaning, prolonged ambulation PERSONAL FACTORS: see above PMH are also affecting patient's functional outcome.  REHAB POTENTIAL: Good  CLINICAL DECISION MAKING: Stable/uncomplicated  EVALUATION COMPLEXITY: Low    GOALS: Short term PT Goals Target date: 02/05/2023   Pt will be I and compliant with HEP. Baseline:  Goal status: New   Long term PT goals Target date:03/05/2023  Pt will improve left glute strength to at least 4+/5 MMT to improve functional strength Baseline: Goal status: New Pt will improve FOTO from 66% to at least 70% functional to show improved function Baseline: Goal status: Ne Pt will reduce pain to overall less than 2-3/10 with usual activity and housework including vacuum Baseline: Goal status: New Pt will be able to ambulate with less overall complaints Baseline: Goal status: New  PLAN: PT FREQUENCY: 1-3 times per week   PT DURATION: 6-8 weeks  PLANNED INTERVENTIONS (unless contraindicated): aquatic PT, Canalith repositioning, cryotherapy, Electrical stimulation, Iontophoresis with 4 mg/ml dexamethasome, Moist heat, traction,  Ultrasound, gait training, Therapeutic exercise, balance training, neuromuscular re-education, patient/family education, prosthetic training, manual techniques, passive ROM, dry needling, taping, vasopnuematic device, vestibular, spinal manipulations, joint manipulations 97110-Therapeutic exercises, 97530- Therapeutic activity, 97112- Neuromuscular re-education, 97535- Self Care, and 16109- Manual therapy  PLAN FOR NEXT SESSION: review and update HEP PRN, consider aquatic PT, focus on glute strengthening and core strength. Did she get heel lift?    April Manson, PT,DPT 01/08/2023, 9:42 AM

## 2023-01-10 ENCOUNTER — Encounter: Payer: 59 | Attending: Physical Medicine and Rehabilitation | Admitting: Physical Medicine and Rehabilitation

## 2023-01-10 ENCOUNTER — Encounter: Payer: Self-pay | Admitting: Physical Medicine and Rehabilitation

## 2023-01-10 VITALS — BP 114/75 | HR 70 | Ht 66.0 in | Wt 213.0 lb

## 2023-01-10 DIAGNOSIS — M7918 Myalgia, other site: Secondary | ICD-10-CM | POA: Insufficient documentation

## 2023-01-10 MED ORDER — LIDOCAINE HCL 1 % IJ SOLN
3.0000 mL | Freq: Once | INTRAMUSCULAR | Status: AC
  Administered 2023-01-10: 3 mL

## 2023-01-10 MED ORDER — SODIUM CHLORIDE (PF) 0.9 % IJ SOLN: 2.0000 mL | INTRAMUSCULAR | Status: DC | PRN

## 2023-01-10 NOTE — Progress Notes (Signed)

## 2023-01-13 ENCOUNTER — Other Ambulatory Visit (HOSPITAL_BASED_OUTPATIENT_CLINIC_OR_DEPARTMENT_OTHER): Payer: Self-pay

## 2023-01-13 MED ORDER — FLULAVAL 0.5 ML IM SUSY
0.5000 mL | PREFILLED_SYRINGE | Freq: Once | INTRAMUSCULAR | 0 refills | Status: AC
  Filled 2023-01-13: qty 0.5, 1d supply, fill #0

## 2023-01-22 ENCOUNTER — Encounter: Payer: 59 | Admitting: Physical Therapy

## 2023-01-22 ENCOUNTER — Encounter: Payer: 59 | Admitting: Rehabilitative and Restorative Service Providers"

## 2023-02-04 NOTE — Therapy (Signed)
OUTPATIENT OCCUPATIONAL THERAPY  TREATMENT NOTE  Patient Name: Mia Thomas MRN: [id number] DOB:11/03/1970, 52 y.o., female Today's Date: 02/04/2023  PCP: Angela Adam, NP REFERRING PROVIDER: Pollyann Savoy, MD   END OF SESSION:    Past Medical History:  Diagnosis Date   Allergy    Anxiety    Asthma    Depression    Scoliosis    Shingles    Stevens-Johnson syndrome (HCC)    as a child    Past Surgical History:  Procedure Laterality Date   CESAREAN SECTION     CHOLECYSTECTOMY     CHOLECYSTECTOMY     EYE SURGERY     repair corneal tare   knee arthroscopy x 2     Left x 2; right x 1   WISDOM TOOTH EXTRACTION     Patient Active Problem List   Diagnosis Date Noted   Joint swelling 05/29/2022   SI (sacroiliac) joint dysfunction 12/19/2020   Somatic dysfunction of spine, sacral 12/19/2020   Pain of left hip joint 02/07/2020   Panic anxiety syndrome 10/16/2016   Perimenopausal symptoms 10/16/2016   Depression 04/28/2016   Anxiety 04/28/2016   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  Rationale for Evaluation and Treatment: Rehabilitation  PERTINENT HISTORY: Per MD notes: She has pmhx of Lupus erythematosus tumidus, Raynauds, X-rays showed right fifth DIP joint severe narrowing and erosive changes. X-rays were reviewed with the patient. Her husband noted dactylitis in the past   She goes by [place], states she is not effected much by her Lupus, but she's been having some joint stiffness and pain, especially in L hand SF DIP J. She wants to learn strategies for long-term management of her issues. She also c/o "tennis elbow" in bil arms for the past 6 months.  That issue is mildly aggravating for her as well.  She does a lot of cooking, cleaning, helps her mother transfer, etc.   PRECAUTIONS: None ;  RED FLAGS:  None  WEIGHT BEARING RESTRICTIONS: No    SUBJECTIVE:   SUBJECTIVE STATEMENT: She arrives after not coming into therapy for a month after her initial evaluation.  Today she states ***.     PAIN:  Are you having pain? Yes: NPRS scale: *** 0.5/10 at rest in bil UEs and at worst in the past week, up to *** 4-5 /10 Pain location: :Lt hand SF DIP J, and hands Pain description: aching at times  Aggravating factors: weight bearing, repetition Relieving factors: rest, heat    PATIENT GOALS: To decrease pain and stiffness in bilateral hands and prevent future deformity    OBJECTIVE: (All objective assessments below are from initial evaluation on: 01/08/23 unless otherwise specified.)    HAND DOMINANCE: Right   ADLs: Overall ADLs: States decreased ability to grab, hold household objects, pain and difficulty to open containers, perform FMS tasks (manipulate fasteners on clothing)   FUNCTIONAL OUTCOME MEASURES: Eval: Quick DASH 16% impairment today  (Higher % Score  =  More Impairment)     UPPER EXTREMITY ROM     Shoulder to Wrist AROM Right eval Left eval Rt / Lt  02/05/23  Forearm supination  89   Forearm pronation   85   Wrist flexion 65 70 *** / ***  Wrist extension 68 63 *** / ***  (Blank rows = not tested)  Hand AROM Left eval Lt 02/05/23  Full Fist Ability (or Gap to Distal Palmar Crease) 2.8cm from tip SF to [facility] ***cm gap from tip L SF to [facility]  Thumb Opposition  (Kapandji Scale)  10/10   Thumb MCP (0-60)    Thumb IP (0-80)    Thumb Radial Abduction Span     Thumb Palmar Abduction Span     Index MCP (0-90)     Index PIP (0-100)     Index DIP (0-70)     Long MCP (0-90)     Long PIP (0-100)     Long DIP (0-70)     Ring MCP (0-90)     Ring PIP (0-100)     Ring DIP (0-70)     Little MCP (0-90) 0-34   (0-63 in isolation) 0 - ***  Little PIP (0-100)  0-69  0 - ***  Little DIP (0-70) 0- 56  (15* radial angulation)  0 - ***  (Blank rows = not  tested)   UPPER EXTREMITY MMT:     MMT Right 01/08/23 Left 01/08/23  Forearm supination    Forearm pronation    Wrist flexion 5/5 4/5 tender  Wrist extension 4-/5 pain 4-/5 pain  Wrist ulnar deviation    Wrist radial deviation    (Blank rows = not tested)  HAND FUNCTION: Eval: Observed weakness in affected Lt hand.  Grip strength Right: 42 lbs, Left: 29 lbs   COORDINATION: Eval: Observed coordination impairments with affected Lt hand.  Details TBD  COGNITION: Eval: Overall cognitive status: WFL for evaluation today, she does appear somewhat anxious and laughs about having stress and anxiety issues   OBSERVATIONS:   Eval: She has some tenderness to both lateral epicondyles and extensor wad's, left hand small finger DIP joint has a rotational deformity from apparent arthritis but is still somewhat actively and passively flexible though very stiff.  Other DIP joints are starting to stiffen and develop Heberden's nodes as well.  She also has the habit of avoiding using her small finger which is likely creating extrinsic tightness through it.  She has positive test for intrinsic tightness for her other fingers but positive extrinsic tightness for the small finger.   TODAY'S TREATMENT:  02/05/23: ***  Go over initial recommendations and HEP thoroughly  PROG  for time! ***   Post-evaluation treatment:   She was given safety/self-care education on preventing pain, avoiding repetitive postures and motions, managing arthritis in both of her hands to reduce wear and tear.  She was given multiple handouts on these issues with explanation from OT and specific functional examples.  She was also recommended to get supportive bracing/gloves for the thumb and fingers to help reduce wear and tear and given examples of these.  Additionally, she was educated on the use of a counterforce/elbow brace for tennis elbow symptoms and shown how to wear it correctly.  She can also get a compression sleeve which  would also be helpful.  She is educated to avoid pronation elbow extension and wrist extension and lift with palm up position.  Lastly, she was given the following home exercise program and it was reviewed with her very briefly due to the time of evaluation today taking quite a while.  It will need reviewed in upcoming sessions, and she was told that if she attempts any of these and they are painful she should stop and wait to see the therapist for more detailed explanation.  She states understanding and thanks  the therapist as she leaves.  Exercises - Tricep Stretch- DO SEATED BY TABLE  - 3-4 x daily - 3 reps - 15 hold - Wrist Flexion Stretch  - 4 x daily - 3-5 reps - 15 sec hold - Wrist Prayer Stretch  - 4 x daily - 3-5 reps - 15 sec hold - Standing Radial Nerve Glide  - 4-6 x daily - 10 reps - BACK KNUCKLE STRETCHES   - 4 x daily - 3-5 reps - 15 sec hold - HOOK Stretch  - 4 x daily - 3-5 reps - 15-20 sec hold - Seated Finger Composite Flexion Stretch  - 4 x daily - 3-5 reps - 15 hold - Tendon Glides  - 4-6 x daily - 3-5 reps - 2-3 seconds hold    PATIENT EDUCATION: Education details: See tx section above for details  Person educated: Patient Education method: Verbal Instruction, Teach back, Handouts  Education comprehension: States and demonstrates understanding, Additional Education required    HOME EXERCISE PROGRAM: Access Code: [email or web address] URL: [email or web address] Date: 01/08/2023 Prepared by: Fannie Knee     GOALS: Goals reviewed with patient? Yes   SHORT TERM GOALS: (STG required if POC>30 days) Target Date: 01/17/23  Pt will obtain protective, custom orthotic. Goal status: TBD/PRN  2.  Pt will demo/state understanding of initial HEP to improve pain levels and prerequisite motion. Goal status: INITIAL   LONG TERM GOALS: Target Date: 02/07/23  Pt will improve functional ability by decreased impairment per Quick DASH assessment from 16% to 10% or  better, for better quality of life. Goal status: INITIAL ***  2.  Pt will improve grip strength in Lt  hand from 29lbs to at least 35lbs for functional use at home and in IADLs. Goal status: INITIAL  3.  Pt will improve A/ROM in Lt SF TAM from 159* to at least 210*, to have functional motion for tasks like reach and grasp.  Goal status: INITIAL  4.  Pt will improve strength in bil wrist ext from 4-/5 MMT to at least 4+/5 MMT to have increased functional ability to carry out selfcare and higher-level homecare tasks with less difficulty. Goal status: INITIAL  5.  Pt will improve coordination skills in Lt hand, as seen by [facility] score on 9HPT testing to have increased functional ability to carry out fine motor tasks (fasteners, etc.) and more complex, coordinated IADLs (meal prep, sports, etc.).  Goal status: INITIAL- TBD initial measures  6.  Pt will decrease pain at worst from 4-5/10 to 2/10 or better to have better sleep and occupational participation in daily roles. Goal status: INITIAL   ASSESSMENT:  CLINICAL IMPRESSION: 02/05/23: ***  Eval: Patient is a 52 y.o. 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  She states these things decrease her overall function, cause pain, or a hindrance to her ADLs/IADLs.  She will benefit from outpatient occupational therapy to decrease negative symptoms and increase quality of life and learn prevention strategies.Marland Kitchen      PLAN:  OT FREQUENCY: 1-2x/week  OT DURATION: 4 weeks through 02/07/23 as needed and up to 6 visits   PLANNED INTERVENTIONS: 97168 OT Re-evaluation, 97535 self care/ADL training, 54098 therapeutic exercise, 97530 therapeutic activity, 97140 manual therapy, 97035 ultrasound, 97039 fluidotherapy, 97010 moist heat,  97010 cryotherapy, 97034 contrast bath,  40981 Orthotics management and training, 19147 Splinting (initial encounter), [id number] Subsequent splinting/medication, passive range of motion, compression bandaging, Dry needling, energy conservation, coping strategies training, patient/family education, and DME and/or AE instructions  RECOMMENDED OTHER SERVICES: getting PT for hip/back issues now   CONSULTED AND AGREED WITH PLAN OF CARE: Patient  PLAN FOR NEXT SESSION:   ***   Fannie Knee, OTR/L, CHT 02/04/2023, 5:31 PM

## 2023-02-05 ENCOUNTER — Ambulatory Visit: Payer: 59 | Admitting: Rehabilitative and Restorative Service Providers"

## 2023-02-05 ENCOUNTER — Encounter: Payer: Self-pay | Admitting: Rehabilitative and Restorative Service Providers"

## 2023-02-05 DIAGNOSIS — M25521 Pain in right elbow: Secondary | ICD-10-CM

## 2023-02-05 DIAGNOSIS — M25522 Pain in left elbow: Secondary | ICD-10-CM

## 2023-02-05 DIAGNOSIS — M79642 Pain in left hand: Secondary | ICD-10-CM | POA: Diagnosis not present

## 2023-02-05 DIAGNOSIS — M6281 Muscle weakness (generalized): Secondary | ICD-10-CM | POA: Diagnosis not present

## 2023-02-05 DIAGNOSIS — M79641 Pain in right hand: Secondary | ICD-10-CM | POA: Diagnosis not present

## 2023-02-05 DIAGNOSIS — M25641 Stiffness of right hand, not elsewhere classified: Secondary | ICD-10-CM

## 2023-02-05 DIAGNOSIS — M25642 Stiffness of left hand, not elsewhere classified: Secondary | ICD-10-CM

## 2023-02-07 ENCOUNTER — Ambulatory Visit (INDEPENDENT_AMBULATORY_CARE_PROVIDER_SITE_OTHER): Payer: 59 | Admitting: Physical Therapy

## 2023-02-07 ENCOUNTER — Encounter: Payer: Self-pay | Admitting: Physical Therapy

## 2023-02-07 DIAGNOSIS — M25552 Pain in left hip: Secondary | ICD-10-CM | POA: Diagnosis not present

## 2023-02-07 DIAGNOSIS — M6281 Muscle weakness (generalized): Secondary | ICD-10-CM | POA: Diagnosis not present

## 2023-02-07 DIAGNOSIS — M5459 Other low back pain: Secondary | ICD-10-CM

## 2023-02-07 NOTE — Therapy (Signed)
OUTPATIENT PHYSICAL THERAPY TREATMENT   Patient Name: Mia Thomas MRN: [id number] DOB:1971/01/25, 52 y.o., female Today's Date: 02/07/2023  END OF SESSION:  PT End of Session - 02/07/23 0921     Visit Number 2    Number of Visits 12    Date for PT Re-Evaluation 03/05/23    PT Start Time 0915    PT Stop Time 1000    PT Time Calculation (min) 45 min    Activity Tolerance Patient tolerated treatment well    Behavior During Therapy WFL for tasks assessed/performed             Past Medical History:  Diagnosis Date   Allergy    Anxiety    Asthma    Depression    Scoliosis    Shingles    Stevens-Johnson syndrome (HCC)    as a child    Past Surgical History:  Procedure Laterality Date   CESAREAN SECTION     CHOLECYSTECTOMY     CHOLECYSTECTOMY     EYE SURGERY     repair corneal tare   knee arthroscopy x 2     Left x 2; right x 1   WISDOM TOOTH EXTRACTION     Patient Active Problem List   Diagnosis Date Noted   Joint swelling 05/29/2022   SI (sacroiliac) joint dysfunction 12/19/2020   Somatic dysfunction of spine, sacral 12/19/2020   Pain of left hip joint 02/07/2020   Panic anxiety syndrome 10/16/2016   Perimenopausal symptoms 10/16/2016   Depression 04/28/2016   Anxiety 04/28/2016   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   Allergic rhinitis 01/08/2016    PCP: Shirline Frees, NP   REFERRING PROVIDER: Horton Chin, MD   REFERRING DIAG:  [phone] (ICD-10-CM) - Facet arthropathy  M76.00 (ICD-10-CM) - Gluteal tendinitis, unspecified laterality    Rationale for Evaluation and Treatment: Rehabilitation  THERAPY DIAG:  Other low back pain  Pain in left hip  Muscle weakness (generalized)  ONSET DATE: Chronic pain since 2020  SUBJECTIVE:                                                                                                                                                                                           SUBJECTIVE STATEMENT: She relays  overall compliance with with HEP, it has been helping. The heel lift has made a big positive difference  PERTINENT HISTORY:  Lupus,Raynauds,Polyarthagia with chronic pain,scoliosis  PAIN:  NPRS scale: 1 currently but can get up to 5/10 upon arrival Pain location:left lateral hip and ITB and low back Pain description: constant low ache but can be sharp Aggravating factors: vacuum, lifting, running so  longer even attempts to run Relieving factors: heat   PRECAUTIONS:  diastasis recti  RED FLAGS: None   WEIGHT BEARING RESTRICTIONS: No  FALLS:  Has patient fallen in last 6 months? No  OCCUPATION: retired  PLOF: Independent  PATIENT GOALS: Be able to walk   NEXT MD VISIT: 01/10/23  OBJECTIVE:  Note: Objective measures were completed at Evaluation unless otherwise noted.  DIAGNOSTIC FINDINGS:  Lumbar XR Impression: These findings are suggestive of mild levoscoliosis and L5-S1  narrowing with mild facet joint arthropathy.   Impression: Mild degenerative changes were noted between C5-C6.   PATIENT SURVEYS:  Eval FOTO 66% functional  SCREENING FOR RED FLAGS: No bowel/bladder incontinence  COGNITION: Overall cognitive status: Within functional limits for tasks assessed      POSTURE: scoliosis with left hip higher and Rt leg shorter measured 1/4 inch functional LLD in standing   LUMBAR ROM:   AROM eval  Flexion WNL repated flexion felt better  Extension WNL no change with repeated extension  Right lateral flexion   Left lateral flexion   Right rotation   Left rotation    (Blank rows = not tested)  LOWER EXTREMITY ROM:   WNL gross assessment    LOWER EXTREMITY MMT:    MMT Right eval Left eval  Hip flexion  5  Hip extension  4+  Hip abduction  4  Hip adduction    Hip internal rotation    Hip external rotation    Knee flexion  5  Knee extension  5  Ankle dorsiflexion  5  Ankle plantarflexion    Ankle inversion    Ankle eversion     (Blank rows =  not tested)  LUMBAR SPECIAL TESTS:  Straight leg raise test: Negative and Slump test: Negative  GAIT: Comments:Independent community Ambulator, pain with prolonged walking, left leg longer  TODAY'S TREATMENT:  02/07/23 Pt seen for aquatic therapy today.  Treatment took place in water 3.5-4.75 ft in depth at the [facility] pool. Temp of water was 91.  Pt entered/exited the pool via stairs with hand rail.   Pt requires the buoyancy and hydrostatic pressure of water for support, and to offload joints by unweighting joint load by at least 50 % in navel deep water and by at least 75-80% in chest to neck deep water.  Viscosity of the water is needed for resistance of strengthening. Water current perturbations provides challenge to standing balance requiring increased core activation. Aquatic PT exercises Forward walking and backward walking 4 round trips Sidestepping with hop length of pool 4 round trips Front/back jogging 3 round trips in pool Lateral hops across the pool 4 round trips Leg swings hip abd/add X 20 bilat with UE support with UE support Leg swings hip flexion/extension X 20 bilat Hip circles CW and CCW X 15 each bilat March walking 3 round trips Lunge step to pool wall, touch wall, then step back, X 15 bilat Hops DL in/out X 20 Scissor hops (needed deeper water) X 20 bilat Cycling on pool noodle X 5 min for LE ROM and spinal decompression Aquatic HEP creation and review with demonstration and trial set preformed, see below for details    PATIENT EDUCATION: Education details: HEP, PT plan of care Person educated: Patient Education method: Explanation, Demonstration, Verbal cues, and Handouts Education comprehension: verbalized understanding and needs further education   HOME EXERCISE PROGRAM: Access Code: [email or web address] URL: [email or web address] Date: 01/08/2023 Prepared by: Ivery Quale  Exercises - Supine ITB Stretch with Strap  -  2 x daily - 6  x weekly - 3 reps - 30 hold - Supine Piriformis Stretch with Foot on Ground (Mirrored)  - 2 x daily - 6 x weekly - 1 sets - 3 reps - 30 hold - Seated Flexion Stretch with Swiss Ball  - 2 x daily - 6 x weekly - 1 sets - 10 reps - 10 sec hold - Seated Thoracic Flexion and Rotation with Swiss Ball (Mirrored)  - 2 x daily - 6 x weekly - 1 sets - 10 reps - 10 sec hold - Figure 4 Bridge  - 2 x daily - 6 x weekly - 1-2 sets - 10 reps - Hooklying Isometric Hip Flexion  - 2 x daily - 6 x weekly - 1-2 sets - 10 reps - 5 sec hold - Standing Hip Hiking  - 2 x daily - 6 x weekly - 2 sets - 10 reps - Side Stepping with Resistance at Ankles  - 2 x daily - 6 x weekly - 2 sets - 10 reps  Patient Education - [facility]  AQUATIC HEP Access Code: [email or web address] URL: [email or web address] Date: 02/07/2023 Prepared by: Ivery Quale  Exercises - Standing March at [facility]  - 1 x daily - 2 x weekly - 15 reps - Standing Hip Flexion Extension at [employer]  - 1 x daily - 2 x weekly - 15 reps - Standing Hip Abduction Adduction at Pool Wall  - 1 x daily - 2 x weekly - 20 reps - Forward Walking  - 1 x daily - 2 x weekly - 4 sets - Backward Walking  - 1 x daily - 2 x weekly - 4 sets - Side Stepping  - 2 x daily - 2 x weekly - 4 sets - 10 reps - Squat  - 1 x daily - 2 x weekly - 15 reps - Lunge to Target at [employer]  - 1 x daily - 2 x weekly - 1 sets - 20 reps - shoulder horizontal add-abd yoga  - 1 x daily - 2 x weekly - 20 reps - Forward Jog in Shallow Water  - 1 x daily - 2 x weekly - 4 sets - Alternating Arms Jumping in Place  - 1 x daily - 2 x weekly - 2 sets - 10 reps - Lateral Hops in Shallow Water  - 1 x daily - 2 x weekly - 1 sets - 10 reps - Forward Monster Walk  - 1 x daily - 2 x weekly - 4 sets - Standing Single Leg Hip Circles  - 1 x daily - 2 x weekly - 1 sets - 20 reps  ASSESSMENT:  CLINICAL IMPRESSION:  She had her first PT session today with good overall tolerance noted to  this. The water helps improve her overall exercise tolerance for dynamic movements and strengthening. She enjoyed this session and wanted to schedule 2 more appointments at this time. I printed out aquatic HEP for her as well.   Per eval: Patient referred to PT for chronic back pain and left hip pain. Of note she reports ITB tear in past and feels she never fully recovered from this. Her ITB remains very tight upon assessment today. MD recommending left glute strengthening and core strengthening which was provided today in HEP. She was found to have 1/4 inch LLD (Rt leg shorter) in standing so I did also provide her with heel lift recommendation. Due to her systemic co-morbidities with polyarthralgia I  do also recommend trial of aquatic PT with her.  Patient will benefit from skilled PT to address below impairments, limitations and improve overall function.  OBJECTIVE IMPAIRMENTS: decreased activity tolerance, difficulty walking, decreased endurance, decreased mobility, decreased ROM, decreased strength, impaired flexibility, impaired LE use, postural dysfunction, and pain.  ACTIVITY LIMITATIONS: bending, lifting, carry, locomotion, cleaning, prolonged ambulation PERSONAL FACTORS: see above PMH are also affecting patient's functional outcome.  REHAB POTENTIAL: Good  CLINICAL DECISION MAKING: Stable/uncomplicated  EVALUATION COMPLEXITY: Low    GOALS: Short term PT Goals Target date: 02/05/2023   Pt will be I and compliant with HEP. Baseline:  Goal status: New   Long term PT goals Target date:03/05/2023  Pt will improve left glute strength to at least 4+/5 MMT to improve functional strength Baseline: Goal status: New Pt will improve FOTO from 66% to at least 70% functional to show improved function Baseline: Goal status: Ne Pt will reduce pain to overall less than 2-3/10 with usual activity and housework including vacuum Baseline: Goal status: New Pt will be able to ambulate with  less overall complaints Baseline: Goal status: New  PLAN: PT FREQUENCY: 1-3 times per week   PT DURATION: 6-8 weeks  PLANNED INTERVENTIONS (unless contraindicated): aquatic PT, Canalith repositioning, cryotherapy, Electrical stimulation, Iontophoresis with 4 mg/ml dexamethasome, Moist heat, traction, Ultrasound, gait training, Therapeutic exercise, balance training, neuromuscular re-education, patient/family education, prosthetic training, manual techniques, passive ROM, dry needling, taping, vasopnuematic device, vestibular, spinal manipulations, joint manipulations 97110-Therapeutic exercises, 97530- Therapeutic activity, [id number]- Neuromuscular re-education, 97535- Self Care, and [id number]- Manual therapy  PLAN FOR NEXT SESSION: 2 more aquatic PT sessions    April Manson, PT,DPT 02/07/2023, 9:22 AM

## 2023-02-12 ENCOUNTER — Encounter: Payer: 59 | Admitting: Rehabilitative and Restorative Service Providers"

## 2023-02-13 ENCOUNTER — Other Ambulatory Visit (HOSPITAL_COMMUNITY): Payer: Self-pay

## 2023-02-14 ENCOUNTER — Ambulatory Visit (INDEPENDENT_AMBULATORY_CARE_PROVIDER_SITE_OTHER): Payer: 59 | Admitting: Physical Therapy

## 2023-02-14 ENCOUNTER — Encounter: Payer: Self-pay | Admitting: Physical Therapy

## 2023-02-14 DIAGNOSIS — M6281 Muscle weakness (generalized): Secondary | ICD-10-CM

## 2023-02-14 DIAGNOSIS — M25552 Pain in left hip: Secondary | ICD-10-CM

## 2023-02-14 DIAGNOSIS — M5459 Other low back pain: Secondary | ICD-10-CM

## 2023-02-14 NOTE — Therapy (Signed)
OUTPATIENT PHYSICAL THERAPY TREATMENT   Patient Name: Mia Thomas MRN: [id number] DOB:05-10-1970, 52 y.o., female Today's Date: 02/14/2023  END OF SESSION:  PT End of Session - 02/14/23 1023     Visit Number 3    Number of Visits 12    Date for PT Re-Evaluation 03/05/23    PT Start Time 1015    PT Stop Time 1100    PT Time Calculation (min) 45 min    Activity Tolerance Patient tolerated treatment well    Behavior During Therapy WFL for tasks assessed/performed             Past Medical History:  Diagnosis Date   Allergy    Anxiety    Asthma    Depression    Scoliosis    Shingles    Stevens-Johnson syndrome (HCC)    as a child    Past Surgical History:  Procedure Laterality Date   CESAREAN SECTION     CHOLECYSTECTOMY     CHOLECYSTECTOMY     EYE SURGERY     repair corneal tare   knee arthroscopy x 2     Left x 2; right x 1   WISDOM TOOTH EXTRACTION     Patient Active Problem List   Diagnosis Date Noted   Joint swelling 05/29/2022   SI (sacroiliac) joint dysfunction 12/19/2020   Somatic dysfunction of spine, sacral 12/19/2020   Pain of left hip joint 02/07/2020   Panic anxiety syndrome 10/16/2016   Perimenopausal symptoms 10/16/2016   Depression 04/28/2016   Anxiety 04/28/2016   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   Allergic rhinitis 01/08/2016    PCP: Shirline Frees, NP   REFERRING PROVIDER: Horton Chin, MD   REFERRING DIAG:  [phone] (ICD-10-CM) - Facet arthropathy  M76.00 (ICD-10-CM) - Gluteal tendinitis, unspecified laterality    Rationale for Evaluation and Treatment: Rehabilitation  THERAPY DIAG:  Other low back pain  Pain in left hip  Muscle weakness (generalized)  ONSET DATE: Chronic pain since 2020  SUBJECTIVE:                                                                                                                                                                                           SUBJECTIVE STATEMENT: She relays  has overall been feeling better with less pain, the bridge exercise is getting easier. She says felt really good for 3 days after the last aquatic PT session and felt the cycling while floating on noodle really helped  PERTINENT HISTORY:  Lupus,Raynauds,Polyarthagia with chronic pain,scoliosis  PAIN:  NPRS scale: 1 currently  Pain location:left lateral hip and ITB and low back Pain description:  constant low ache but can be sharp Aggravating factors: vacuum, lifting, running so longer even attempts to run Relieving factors: heat   PRECAUTIONS:  diastasis recti  RED FLAGS: None   WEIGHT BEARING RESTRICTIONS: No  FALLS:  Has patient fallen in last 6 months? No  OCCUPATION: retired  PLOF: Independent  PATIENT GOALS: Be able to walk   NEXT MD VISIT: 01/10/23  OBJECTIVE:  Note: Objective measures were completed at Evaluation unless otherwise noted.  DIAGNOSTIC FINDINGS:  Lumbar XR Impression: These findings are suggestive of mild levoscoliosis and L5-S1  narrowing with mild facet joint arthropathy.   Impression: Mild degenerative changes were noted between C5-C6.   PATIENT SURVEYS:  Eval FOTO 66% functional  SCREENING FOR RED FLAGS: No bowel/bladder incontinence  COGNITION: Overall cognitive status: Within functional limits for tasks assessed      POSTURE: scoliosis with left hip higher and Rt leg shorter measured 1/4 inch functional LLD in standing   LUMBAR ROM:   AROM eval  Flexion WNL repated flexion felt better  Extension WNL no change with repeated extension  Right lateral flexion   Left lateral flexion   Right rotation   Left rotation    (Blank rows = not tested)  LOWER EXTREMITY ROM:   WNL gross assessment    LOWER EXTREMITY MMT:    MMT Right eval Left eval  Hip flexion  5  Hip extension  4+  Hip abduction  4  Hip adduction    Hip internal rotation    Hip external rotation    Knee flexion  5  Knee extension  5  Ankle dorsiflexion  5   Ankle plantarflexion    Ankle inversion    Ankle eversion     (Blank rows = not tested)  LUMBAR SPECIAL TESTS:  Straight leg raise test: Negative and Slump test: Negative  GAIT: Comments:Independent community Ambulator, pain with prolonged walking, left leg longer  TODAY'S TREATMENT:  02/14/23 Pt seen for aquatic therapy today.  Treatment took place in water 3.5-4.75 ft in depth at the [facility] pool. Temp of water was 91.  Pt entered/exited the pool via stairs with hand rail.   Pt requires the buoyancy and hydrostatic pressure of water for support, and to offload joints by unweighting joint load by at least 50 % in navel deep water and by at least 75-80% in chest to neck deep water.  Viscosity of the water is needed for resistance of strengthening. Water current perturbations provides challenge to standing balance requiring increased core activation. Aquatic PT exercises Forward walking and backward walking 4 round trips Sidestepping with hop length of pool 4 round trips Front/back jogging 3 round trips in pool Lateral hops across the pool 4 round trips Leg swings hip abd/add X 20 bilat with UE support with UE support Leg swings hip flexion/extension X 20 bilat Hip circles CW and CCW X 15 each bilat March walking 3 round trips Push/Pull kickboard while walking width of pool 3 round trips Shoulder extension with kickboard X20 Lunge step to pool wall, touch wall, then step back, X 10 bilat Hops DL in/out X 20 Scissor hops (needed deeper water) X 20 bilat Cycling on pool noodle X 10 min for LE ROM and spinal decompression    PATIENT EDUCATION: Education details: HEP, PT plan of care Person educated: Patient Education method: Explanation, Demonstration, Verbal cues, and Handouts Education comprehension: verbalized understanding and needs further education   HOME EXERCISE PROGRAM: Access Code: [email or web address] URL: [email or web address] Date:  01/08/2023 Prepared by: Ivery Quale  Exercises - Supine ITB Stretch with Strap  - 2 x daily - 6 x weekly - 3 reps - 30 hold - Supine Piriformis Stretch with Foot on Ground (Mirrored)  - 2 x daily - 6 x weekly - 1 sets - 3 reps - 30 hold - Seated Flexion Stretch with Swiss Ball  - 2 x daily - 6 x weekly - 1 sets - 10 reps - 10 sec hold - Seated Thoracic Flexion and Rotation with Swiss Ball (Mirrored)  - 2 x daily - 6 x weekly - 1 sets - 10 reps - 10 sec hold - Figure 4 Bridge  - 2 x daily - 6 x weekly - 1-2 sets - 10 reps - Hooklying Isometric Hip Flexion  - 2 x daily - 6 x weekly - 1-2 sets - 10 reps - 5 sec hold - Standing Hip Hiking  - 2 x daily - 6 x weekly - 2 sets - 10 reps - Side Stepping with Resistance at Ankles  - 2 x daily - 6 x weekly - 2 sets - 10 reps  Patient Education - [facility]  AQUATIC HEP Access Code: [email or web address] URL: [email or web address] Date: 02/07/2023 Prepared by: Ivery Quale  Exercises - Standing March at [facility]  - 1 x daily - 2 x weekly - 15 reps - Standing Hip Flexion Extension at [employer]  - 1 x daily - 2 x weekly - 15 reps - Standing Hip Abduction Adduction at Pool Wall  - 1 x daily - 2 x weekly - 20 reps - Forward Walking  - 1 x daily - 2 x weekly - 4 sets - Backward Walking  - 1 x daily - 2 x weekly - 4 sets - Side Stepping  - 2 x daily - 2 x weekly - 4 sets - 10 reps - Squat  - 1 x daily - 2 x weekly - 15 reps - Lunge to Target at [employer]  - 1 x daily - 2 x weekly - 1 sets - 20 reps - shoulder horizontal add-abd yoga  - 1 x daily - 2 x weekly - 20 reps - Forward Jog in Shallow Water  - 1 x daily - 2 x weekly - 4 sets - Alternating Arms Jumping in Place  - 1 x daily - 2 x weekly - 2 sets - 10 reps - Lateral Hops in Shallow Water  - 1 x daily - 2 x weekly - 1 sets - 10 reps - Forward Monster Walk  - 1 x daily - 2 x weekly - 4 sets - Standing Single Leg Hip Circles  - 1 x daily - 2 x weekly - 1 sets - 20  reps  ASSESSMENT:  CLINICAL IMPRESSION:  She had good response to aquatic PT session last time and has overall been feeling better. We continued with same program today in pool and she has one more visit scheduled.   Per eval: Patient referred to PT for chronic back pain and left hip pain. Of note she reports ITB tear in past and feels she never fully recovered from this. Her ITB remains very tight upon assessment today. MD recommending left glute strengthening and core strengthening which was provided today in HEP. She was found to have 1/4 inch LLD (Rt leg shorter) in standing so I did also provide her with heel lift recommendation. Due to her systemic co-morbidities with polyarthralgia I do also recommend trial of  aquatic PT with her.  Patient will benefit from skilled PT to address below impairments, limitations and improve overall function.  OBJECTIVE IMPAIRMENTS: decreased activity tolerance, difficulty walking, decreased endurance, decreased mobility, decreased ROM, decreased strength, impaired flexibility, impaired LE use, postural dysfunction, and pain.  ACTIVITY LIMITATIONS: bending, lifting, carry, locomotion, cleaning, prolonged ambulation PERSONAL FACTORS: see above PMH are also affecting patient's functional outcome.  REHAB POTENTIAL: Good  CLINICAL DECISION MAKING: Stable/uncomplicated  EVALUATION COMPLEXITY: Low    GOALS: Short term PT Goals Target date: 02/05/2023   Pt will be I and compliant with HEP. Baseline:  Goal status: New   Long term PT goals Target date:03/05/2023  Pt will improve left glute strength to at least 4+/5 MMT to improve functional strength Baseline: Goal status: New Pt will improve FOTO from 66% to at least 70% functional to show improved function Baseline: Goal status: Ne Pt will reduce pain to overall less than 2-3/10 with usual activity and housework including vacuum Baseline: Goal status: New Pt will be able to ambulate with less  overall complaints Baseline: Goal status: New  PLAN: PT FREQUENCY: 1-3 times per week   PT DURATION: 6-8 weeks  PLANNED INTERVENTIONS (unless contraindicated): aquatic PT, Canalith repositioning, cryotherapy, Electrical stimulation, Iontophoresis with 4 mg/ml dexamethasome, Moist heat, traction, Ultrasound, gait training, Therapeutic exercise, balance training, neuromuscular re-education, patient/family education, prosthetic training, manual techniques, passive ROM, dry needling, taping, vasopnuematic device, vestibular, spinal manipulations, joint manipulations 97110-Therapeutic exercises, 97530- Therapeutic activity, 97112- Neuromuscular re-education, 97535- Self Care, and [id number]- Manual therapy  PLAN FOR NEXT SESSION: 1 more aquatic PT session then transition to independent program as she will be too busy to attend any more due to her daughters wedding.    April Manson, PT,DPT 02/14/2023, 10:24 AM

## 2023-02-19 ENCOUNTER — Encounter: Payer: 59 | Admitting: Rehabilitative and Restorative Service Providers"

## 2023-02-20 ENCOUNTER — Encounter: Payer: Self-pay | Admitting: Physical Medicine and Rehabilitation

## 2023-02-21 ENCOUNTER — Ambulatory Visit (INDEPENDENT_AMBULATORY_CARE_PROVIDER_SITE_OTHER): Payer: 59 | Admitting: Physical Therapy

## 2023-02-21 ENCOUNTER — Encounter: Payer: Self-pay | Admitting: Physical Therapy

## 2023-02-21 DIAGNOSIS — M25552 Pain in left hip: Secondary | ICD-10-CM

## 2023-02-21 DIAGNOSIS — M5459 Other low back pain: Secondary | ICD-10-CM | POA: Diagnosis not present

## 2023-02-21 DIAGNOSIS — M6281 Muscle weakness (generalized): Secondary | ICD-10-CM

## 2023-02-21 NOTE — Therapy (Signed)
OUTPATIENT PHYSICAL THERAPY TREATMENT/Discharge PHYSICAL THERAPY DISCHARGE SUMMARY  Visits from Start of Care: 4  Current functional level related to goals / functional outcomes: See below   Remaining deficits: See below   Education / Equipment: HEP  Plan:  Patient goals were mostly met. Patient is being discharged as she is feeling good and feels ready to discharge.      Patient Name: Mia Thomas MRN: [id number] DOB:1970-05-24, 52 y.o., female Today's Date: 02/21/2023  END OF SESSION:  PT End of Session - 02/21/23 1015     Visit Number 4    Number of Visits 12    Date for PT Re-Evaluation 03/05/23    PT Start Time 1005    PT Stop Time 1050    PT Time Calculation (min) 45 min    Activity Tolerance Patient tolerated treatment well    Behavior During Therapy WFL for tasks assessed/performed             Past Medical History:  Diagnosis Date   Allergy    Anxiety    Asthma    Depression    Scoliosis    Shingles    Stevens-Johnson syndrome (HCC)    as a child    Past Surgical History:  Procedure Laterality Date   CESAREAN SECTION     CHOLECYSTECTOMY     CHOLECYSTECTOMY     EYE SURGERY     repair corneal tare   knee arthroscopy x 2     Left x 2; right x 1   WISDOM TOOTH EXTRACTION     Patient Active Problem List   Diagnosis Date Noted   Joint swelling 05/29/2022   SI (sacroiliac) joint dysfunction 12/19/2020   Somatic dysfunction of spine, sacral 12/19/2020   Pain of left hip joint 02/07/2020   Panic anxiety syndrome 10/16/2016   Perimenopausal symptoms 10/16/2016   Depression 04/28/2016   Anxiety 04/28/2016   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   Allergic rhinitis 01/08/2016    PCP: Shirline Frees, NP   REFERRING PROVIDER: Horton Chin, MD   REFERRING DIAG:  [phone] (ICD-10-CM) - Facet arthropathy  M76.00 (ICD-10-CM) - Gluteal tendinitis, unspecified laterality    Rationale for Evaluation and Treatment: Rehabilitation  THERAPY DIAG:   Other low back pain  Pain in left hip  Muscle weakness (generalized)  ONSET DATE: Chronic pain since 2020  SUBJECTIVE:                                                                                                                                                                                           SUBJECTIVE STATEMENT: She relays she has been feeling really  good lately. She feels ready to discharge today  PERTINENT HISTORY:  Lupus,Raynauds,Polyarthagia with chronic pain,scoliosis  PAIN:  NPRS scale: 0 currently  Pain location:left lateral hip and ITB and low back Pain description: constant low ache but can be sharp Aggravating factors: vacuum, lifting, running so longer even attempts to run Relieving factors: heat   PRECAUTIONS:  diastasis recti  RED FLAGS: None   WEIGHT BEARING RESTRICTIONS: No  FALLS:  Has patient fallen in last 6 months? No  OCCUPATION: retired  PLOF: Independent  PATIENT GOALS: Be able to walk   NEXT MD VISIT: 01/10/23  OBJECTIVE:  Note: Objective measures were completed at Evaluation unless otherwise noted.  DIAGNOSTIC FINDINGS:  Lumbar XR Impression: These findings are suggestive of mild levoscoliosis and L5-S1  narrowing with mild facet joint arthropathy.   Impression: Mild degenerative changes were noted between C5-C6.   PATIENT SURVEYS:  Eval FOTO 66% functional  SCREENING FOR RED FLAGS: No bowel/bladder incontinence  COGNITION: Overall cognitive status: Within functional limits for tasks assessed      POSTURE: scoliosis with left hip higher and Rt leg shorter measured 1/4 inch functional LLD in standing   LUMBAR ROM:   AROM eval  Flexion WNL repated flexion felt better  Extension WNL no change with repeated extension  Right lateral flexion   Left lateral flexion   Right rotation   Left rotation    (Blank rows = not tested)  LOWER EXTREMITY ROM:   WNL gross assessment    LOWER EXTREMITY MMT:    MMT  Right eval Left eval  Hip flexion  5  Hip extension  4+  Hip abduction  4  Hip adduction    Hip internal rotation    Hip external rotation    Knee flexion  5  Knee extension  5  Ankle dorsiflexion  5  Ankle plantarflexion    Ankle inversion    Ankle eversion     (Blank rows = not tested)  LUMBAR SPECIAL TESTS:  Straight leg raise test: Negative and Slump test: Negative  GAIT: Comments:Independent community Ambulator, pain with prolonged walking, left leg longer  TODAY'S TREATMENT:  02/21/23 Pt seen for aquatic therapy today.  Treatment took place in water 3.5-4.75 ft in depth at the [facility] pool. Temp of water was 91.  Pt entered/exited the pool via stairs with hand rail.   Pt requires the buoyancy and hydrostatic pressure of water for support, and to offload joints by unweighting joint load by at least 50 % in navel deep water and by at least 75-80% in chest to neck deep water.  Viscosity of the water is needed for resistance of strengthening. Water current perturbations provides challenge to standing balance requiring increased core activation. Aquatic PT exercises Forward walking and backward walking 4 round trips Sidestepping withth of pool 4 round trips Front/back jogging 3 round trips in pool Lateral hops across the pool 4 round trips Leg swings hip abd/add X 20 bilat with UE support with UE support Leg swings hip flexion/extension X 20 bilat Hip circles CW and CCW X 15 each bilat Push/Pull kickboard while walking width of pool 3 round trips Shoulder extension with kickboard X20 Stir the pot with water dumbell X 15 CW,CCW Thoracic rotation with water dumbell  X10 bilat Push/pull with water dumbells X 10 bilat Lunge step to pool wall, touch wall, then step back, X 15 bilat Hops DL in/out X 15 Scissor hops (needed deeper water) X 20 bilat Cycling on pool  noodle X 8 min for LE ROM and spinal decompression    PATIENT EDUCATION: Education details: HEP,  PT plan of care Person educated: Patient Education method: Explanation, Demonstration, Verbal cues, and Handouts Education comprehension: verbalized understanding and needs further education   HOME EXERCISE PROGRAM: Access Code: [email or web address] URL: [email or web address] Date: 01/08/2023 Prepared by: Ivery Quale  Exercises - Supine ITB Stretch with Strap  - 2 x daily - 6 x weekly - 3 reps - 30 hold - Supine Piriformis Stretch with Foot on Ground (Mirrored)  - 2 x daily - 6 x weekly - 1 sets - 3 reps - 30 hold - Seated Flexion Stretch with Swiss Ball  - 2 x daily - 6 x weekly - 1 sets - 10 reps - 10 sec hold - Seated Thoracic Flexion and Rotation with Swiss Ball (Mirrored)  - 2 x daily - 6 x weekly - 1 sets - 10 reps - 10 sec hold - Figure 4 Bridge  - 2 x daily - 6 x weekly - 1-2 sets - 10 reps - Hooklying Isometric Hip Flexion  - 2 x daily - 6 x weekly - 1-2 sets - 10 reps - 5 sec hold - Standing Hip Hiking  - 2 x daily - 6 x weekly - 2 sets - 10 reps - Side Stepping with Resistance at Ankles  - 2 x daily - 6 x weekly - 2 sets - 10 reps  Patient Education - [facility]  AQUATIC HEP Access Code: [email or web address] URL: [email or web address] Date: 02/07/2023 Prepared by: Ivery Quale  Exercises - Standing March at [facility]  - 1 x daily - 2 x weekly - 15 reps - Standing Hip Flexion Extension at [employer]  - 1 x daily - 2 x weekly - 15 reps - Standing Hip Abduction Adduction at Pool Wall  - 1 x daily - 2 x weekly - 20 reps - Forward Walking  - 1 x daily - 2 x weekly - 4 sets - Backward Walking  - 1 x daily - 2 x weekly - 4 sets - Side Stepping  - 2 x daily - 2 x weekly - 4 sets - 10 reps - Squat  - 1 x daily - 2 x weekly - 15 reps - Lunge to [employer] at [employer]  - 1 x daily - 2 x weekly - 1 sets - 20 reps - shoulder horizontal add-abd yoga  - 1 x daily - 2 x weekly - 20 reps - Forward Jog in Shallow Water  - 1 x daily - 2 x weekly - 4 sets - Alternating Arms  Jumping in Place  - 1 x daily - 2 x weekly - 2 sets - 10 reps - Lateral Hops in Shallow Water  - 1 x daily - 2 x weekly - 1 sets - 10 reps - Forward Monster Walk  - 1 x daily - 2 x weekly - 4 sets - Standing Single Leg Hip Circles  - 1 x daily - 2 x weekly - 1 sets - 20 reps  ASSESSMENT:  CLINICAL IMPRESSION:  She had good response to aquatic PT session last time and has overall been feeling better. We continued with same program today in pool and she has one more visit scheduled.   OBJECTIVE IMPAIRMENTS: decreased activity tolerance, difficulty walking, decreased endurance, decreased mobility, decreased ROM, decreased strength, impaired flexibility, impaired LE use, postural dysfunction, and pain.  ACTIVITY LIMITATIONS: bending, lifting, carry, locomotion, cleaning, prolonged ambulation PERSONAL  FACTORS: see above PMH are also affecting patient's functional outcome.  REHAB POTENTIAL: Good  CLINICAL DECISION MAKING: Stable/uncomplicated  EVALUATION COMPLEXITY: Low    GOALS: Short term PT Goals Target date: 02/05/2023   Pt will be I and compliant with HEP. Baseline:  Goal status: MET 02/21/23   Long term PT goals Target date:03/05/2023  Pt will improve left glute strength to at least 4+/5 MMT to improve functional strength Baseline: Goal status: did not test at pool for last visits Pt will improve FOTO from 66% to at least 70% functional to show improved function Baseline: Goal status: almost MET 02/21/23, improved to 68% in 5 less visits than predicted Pt will reduce pain to overall less than 2-3/10 with usual activity and housework including vacuum Baseline: Goal status: MET 02/21/23 Pt will be able to ambulate with less overall complaints Baseline: Goal status: MET 02/21/23  PLAN: PT FREQUENCY: 1-3 times per week   PT DURATION: 6-8 weeks  PLANNED INTERVENTIONS (unless contraindicated): aquatic PT, Canalith repositioning, cryotherapy, Electrical stimulation,  Iontophoresis with 4 mg/ml dexamethasome, Moist heat, traction, Ultrasound, gait training, Therapeutic exercise, balance training, neuromuscular re-education, patient/family education, prosthetic training, manual techniques, passive ROM, dry needling, taping, vasopnuematic device, vestibular, spinal manipulations, joint manipulations 97110-Therapeutic exercises, 97530- Therapeutic activity, 97112- Neuromuscular re-education, 97535- Self Care, and 16109- Manual therapy  PLAN FOR NEXT SESSION: 1 more aquatic PT session then transition to independent program as she will be too busy to attend any more due to her daughters wedding.    April Manson, PT,DPT 02/21/2023, 10:21 AM

## 2023-03-13 ENCOUNTER — Ambulatory Visit: Payer: 59 | Admitting: Physical Medicine and Rehabilitation

## 2023-03-17 ENCOUNTER — Other Ambulatory Visit: Payer: Self-pay | Admitting: Obstetrics and Gynecology

## 2023-03-17 NOTE — Telephone Encounter (Signed)
 Med refill request: Lo-Loestrin Last AEX: 02/13/22, Last OV:  07/23/22 Next AEX: 07/14/23 Last MMG (if hormonal med) 04/09/22 Refill authorized: Please Advise, #[phone], 2 RF

## 2023-03-18 ENCOUNTER — Other Ambulatory Visit (HOSPITAL_COMMUNITY): Payer: Self-pay

## 2023-03-18 MED ORDER — LO LOESTRIN FE 1 MG-10 MCG / 10 MCG PO TABS
1.0000 | ORAL_TABLET | Freq: Every day | ORAL | 1 refills | Status: DC
  Filled 2023-03-18: qty 84, 84d supply, fill #0
  Filled 2023-06-19: qty 84, 84d supply, fill #1

## 2023-04-30 ENCOUNTER — Encounter: Payer: 59 | Admitting: Internal Medicine

## 2023-05-12 ENCOUNTER — Other Ambulatory Visit (HOSPITAL_COMMUNITY): Payer: Self-pay

## 2023-05-21 ENCOUNTER — Other Ambulatory Visit: Payer: Self-pay | Admitting: Adult Health

## 2023-05-21 DIAGNOSIS — Z1231 Encounter for screening mammogram for malignant neoplasm of breast: Secondary | ICD-10-CM

## 2023-05-26 ENCOUNTER — Ambulatory Visit
Admission: RE | Admit: 2023-05-26 | Discharge: 2023-05-26 | Disposition: A | Discharge status: Home / Self Care | Source: Ambulatory Visit | Attending: Adult Health

## 2023-05-26 DIAGNOSIS — Z1231 Encounter for screening mammogram for malignant neoplasm of breast: Secondary | ICD-10-CM

## 2023-07-08 ENCOUNTER — Ambulatory Visit: Payer: 59 | Admitting: Obstetrics and Gynecology

## 2023-07-14 ENCOUNTER — Encounter: Payer: Self-pay | Admitting: Obstetrics and Gynecology

## 2023-07-14 ENCOUNTER — Ambulatory Visit (INDEPENDENT_AMBULATORY_CARE_PROVIDER_SITE_OTHER): Payer: 59 | Admitting: Obstetrics and Gynecology

## 2023-07-14 ENCOUNTER — Other Ambulatory Visit (HOSPITAL_COMMUNITY): Payer: Self-pay

## 2023-07-14 VITALS — BP 124/86 | HR 84 | Ht 66.5 in | Wt 197.0 lb

## 2023-07-14 DIAGNOSIS — Z01419 Encounter for gynecological examination (general) (routine) without abnormal findings: Secondary | ICD-10-CM | POA: Diagnosis not present

## 2023-07-14 DIAGNOSIS — Z1331 Encounter for screening for depression: Secondary | ICD-10-CM | POA: Diagnosis not present

## 2023-07-14 MED ORDER — LO LOESTRIN FE 1 MG-10 MCG / 10 MCG PO TABS
1.0000 | ORAL_TABLET | Freq: Every day | ORAL | 3 refills | Status: DC
  Filled 2023-07-14 – 2023-08-27 (×3): qty 84, 84d supply, fill #0
  Filled ????-??-??: fill #0

## 2023-07-14 NOTE — Patient Instructions (Signed)

## 2023-07-14 NOTE — Progress Notes (Signed)
 53 y.o. G2P2 Married Caucasian female here for annual exam.  Has started having a period again so she would like to discuss blood work for menopause.   Doing really well on her birth control pills.  Stable mood, sleeping well.  Lost 20 pounds.   Menses:  January, late April.  They are manageable.   PCP: Alto Atta, NP   Patient's last menstrual period was 06/27/2023 (exact date).           Sexually active: Yes.    The current method of family planning is vasectomy.    Menopausal hormone therapy:  n/a Exercising: walking, swimming.  Smoker:  no  OB History  Gravida Para Term Preterm AB Living  2 2    2   SAB IAB Ectopic Multiple Live Births          # Outcome Date GA Lbr Len/2nd Weight Sex Type Anes PTL Lv  2 Para           1 Para             Obstetric Comments  +1 adopted     HEALTH MAINTENANCE: Last 2 paps:  02/07/21 neg HR HPV neg, 01/14/17 neg  History of abnormal Pap or positive HPV:  no Mammogram:   05/26/23 Breast Density Cat B, BIRADS Cat 1 neg  Colonoscopy:  10/28/22 - due in 10 years.  Bone Density:  n/a  Result  n/a   Immunization History  Administered Date(s) Administered   Hepatitis A, Adult 08/18/2019, 03/09/2020   Influenza Inj Mdck Quad Pf 12/05/2016   Influenza, Seasonal, Injecte, Preservative Fre 01/13/2023   Influenza,inj,Quad PF,6+ Mos 11/27/2017   Influenza-Unspecified 12/15/2013, 12/05/2016, 12/04/2020, 11/20/2021   PFIZER(Purple Top)SARS-COV-2 Vaccination 03/23/2019, 04/12/2019, 11/14/2019   Pfizer(Comirnaty)Fall Seasonal Vaccine 12 years and older 12/21/2021   Tdap 08/27/2007, 04/05/2010, 09/01/2020   Zoster Recombinant(Shingrix) 07/27/2021      reports that she has never smoked. She has never been exposed to tobacco smoke. She has never used smokeless tobacco. She reports that she does not currently use alcohol. She reports that she does not use drugs.  Past Medical History:  Diagnosis Date   Allergy    Anxiety    Asthma     Depression    Scoliosis    Shingles    Stevens-Johnson syndrome (HCC)    as a child     Past Surgical History:  Procedure Laterality Date   CESAREAN SECTION     CHOLECYSTECTOMY     CHOLECYSTECTOMY     EYE SURGERY     repair corneal tare   knee arthroscopy x 2     Left x 2; right x 1   WISDOM TOOTH EXTRACTION      Current Outpatient Medications  Medication Sig Dispense Refill   montelukast  (SINGULAIR ) 10 MG tablet TAKE 1 TABLET BY MOUTH AT BEDTIME 90 tablet 3   Omega-3 Fatty Acids (FISH OIL PO) Take by mouth.     TART CHERRY PO Take by mouth.     traZODone  (DESYREL ) 100 MG tablet Take 1 tablet (100 mg total) by mouth at bedtime. Take as needed. 90 tablet 3   TURMERIC PO Take by mouth.     Norethindrone-Ethinyl Estradiol -Fe Biphas (LO LOESTRIN FE ) 1 MG-10 MCG / 10 MCG tablet Take 1 tablet by mouth daily. 84 tablet 3   Current Facility-Administered Medications  Medication Dose Route Frequency Provider Last Rate Last Admin   sodium chloride  (PF) 0.9 % injection 2 mL  2  mL Intravenous PRN         ALLERGIES: Sulfa antibiotics and Gramineae pollens  Family History  Problem Relation Age of Onset   Cancer Mother        skin   Atrial fibrillation Mother    Cancer Father        skin   Heart disease Father    Asthma Father    Rheum arthritis Father    Bladder Cancer Father    Prostate cancer Father    Other Father        shingles   Cancer Sister        cluster cancers   Hypertension Sister    Glaucoma Sister    Hypertension Brother    Rheum arthritis Paternal Aunt    Rheum arthritis Paternal Aunt    Rheum arthritis Paternal Uncle    Healthy Daughter    Healthy Son    Rheum arthritis Cousin    Colon cancer Neg Hx    Esophageal cancer Neg Hx    Rectal cancer Neg Hx    Stomach cancer Neg Hx     Review of Systems  All other systems reviewed and are negative.   PHYSICAL EXAM:  BP 124/86 (BP Location: Left Arm, Patient Position: Sitting, Cuff Size: Normal)    Pulse 84   Ht 5' 6.5" (1.689 m)   Wt 197 lb (89.4 kg)   LMP 06/27/2023 (Exact Date) Comment: Has started having periods again, has one in [place] then another April.  SpO2 98%   BMI 31.32 kg/m     General appearance: alert, cooperative and appears stated age Head: normocephalic, without obvious abnormality, atraumatic Neck: no adenopathy, supple, symmetrical, trachea midline and thyroid  normal to inspection and palpation Lungs: clear to auscultation bilaterally Breasts: normal appearance, no masses or tenderness, No nipple retraction or dimpling, No nipple discharge or bleeding, No axillary adenopathy Heart: regular rate and rhythm Abdomen: soft, non-tender; no masses, no organomegaly Extremities: extremities normal, atraumatic, no cyanosis or edema Skin: skin color, texture, turgor normal. No rashes or lesions Lymph nodes: cervical, supraclavicular, and axillary nodes normal. Neurologic: grossly normal  Pelvic: External genitalia:  no lesions              No abnormal inguinal nodes palpated.              Urethra:  normal appearing urethra with no masses, tenderness or lesions              Bartholins and Skenes: normal                 Vagina: normal appearing vagina with normal color and discharge, no lesions              Cervix: no lesions              Pap taken: No. Bimanual Exam:  Uterus:  normal size, contour, position, consistency, mobility, non-tender              Adnexa: no mass, fullness, tenderness              Rectal exam: Yes.  .  Confirms.              Anus:  normal sphincter tone, no lesions  Chaperone was present for exam:  Cottie Diss, CMA  ASSESSMENT: Well woman visit with gynecologic exam. On COCs for perimenopausal symptoms.  PHQ-2: 0  PLAN: Mammogram screening discussed. Self breast awareness reviewed. Pap and HRV collected:  no.   Due in 2027.  Guidelines for Calcium , Vitamin D , regular exercise program including cardiovascular and weight bearing  exercise. Medication refills:  LoLoestrin, 3 packs, 3 refills.  Would need to stop birth control for 2 weeks to check her hormones.   She would like to continue with her pills for now. Follow up:  yearly and prn.

## 2023-07-18 ENCOUNTER — Other Ambulatory Visit (HOSPITAL_COMMUNITY): Payer: Self-pay

## 2023-07-18 ENCOUNTER — Other Ambulatory Visit: Payer: Self-pay | Admitting: Adult Health

## 2023-07-18 ENCOUNTER — Other Ambulatory Visit: Payer: Self-pay

## 2023-07-18 MED ORDER — CLONAZEPAM 2 MG PO TABS
2.0000 mg | ORAL_TABLET | Freq: Every day | ORAL | 1 refills | Status: AC
  Filled 2023-07-18: qty 30, 30d supply, fill #0
  Filled 2023-12-29: qty 30, 30d supply, fill #1

## 2023-07-23 ENCOUNTER — Ambulatory Visit (INDEPENDENT_AMBULATORY_CARE_PROVIDER_SITE_OTHER)

## 2023-07-23 ENCOUNTER — Ambulatory Visit (INDEPENDENT_AMBULATORY_CARE_PROVIDER_SITE_OTHER): Admitting: Adult Health

## 2023-07-23 ENCOUNTER — Encounter: Payer: Self-pay | Admitting: Adult Health

## 2023-07-23 VITALS — BP 110/70 | HR 53 | Temp 98.5°F | Ht 66.0 in | Wt 195.0 lb

## 2023-07-23 DIAGNOSIS — R634 Abnormal weight loss: Secondary | ICD-10-CM

## 2023-07-23 DIAGNOSIS — L930 Discoid lupus erythematosus: Secondary | ICD-10-CM | POA: Diagnosis not present

## 2023-07-23 DIAGNOSIS — Z0000 Encounter for general adult medical examination without abnormal findings: Secondary | ICD-10-CM

## 2023-07-23 DIAGNOSIS — N951 Menopausal and female climacteric states: Secondary | ICD-10-CM | POA: Diagnosis not present

## 2023-07-23 DIAGNOSIS — G4700 Insomnia, unspecified: Secondary | ICD-10-CM

## 2023-07-23 DIAGNOSIS — E7439 Other disorders of intestinal carbohydrate absorption: Secondary | ICD-10-CM

## 2023-07-23 LAB — CBC WITH DIFFERENTIAL/PLATELET
Basophils Absolute: 0.1 10*3/uL (ref 0.0–0.1)
Basophils Relative: 0.9 % (ref 0.0–3.0)
Eosinophils Absolute: 0.1 10*3/uL (ref 0.0–0.7)
Eosinophils Relative: 0.9 % (ref 0.0–5.0)
HCT: 42.9 % (ref 36.0–46.0)
Hemoglobin: 14.3 g/dL (ref 12.0–15.0)
Lymphocytes Relative: 25.2 % (ref 12.0–46.0)
Lymphs Abs: 1.8 10*3/uL (ref 0.7–4.0)
MCHC: 33.5 g/dL (ref 30.0–36.0)
MCV: 91.5 fl (ref 78.0–100.0)
Monocytes Absolute: 0.6 10*3/uL (ref 0.1–1.0)
Monocytes Relative: 8 % (ref 3.0–12.0)
Neutro Abs: 4.5 10*3/uL (ref 1.4–7.7)
Neutrophils Relative %: 65 % (ref 43.0–77.0)
Platelets: 460 10*3/uL — ABNORMAL HIGH (ref 150.0–400.0)
RBC: 4.68 Mil/uL (ref 3.87–5.11)
RDW: 13.1 % (ref 11.5–15.5)
WBC: 7 10*3/uL (ref 4.0–10.5)

## 2023-07-23 LAB — HEMOGLOBIN A1C: Hgb A1c MFr Bld: 5.8 % (ref 4.6–6.5)

## 2023-07-23 LAB — COMPREHENSIVE METABOLIC PANEL WITH GFR
ALT: 16 U/L (ref 0–35)
AST: 18 U/L (ref 0–37)
Albumin: 4.6 g/dL (ref 3.5–5.2)
Alkaline Phosphatase: 55 U/L (ref 39–117)
BUN: 16 mg/dL (ref 6–23)
CO2: 26 meq/L (ref 19–32)
Calcium: 9.5 mg/dL (ref 8.4–10.5)
Chloride: 102 meq/L (ref 96–112)
Creatinine, Ser: 0.77 mg/dL (ref 0.40–1.20)
GFR: 88.3 mL/min (ref >60.00)
Glucose, Bld: 90 mg/dL (ref 70–99)
Potassium: 4.6 meq/L (ref 3.5–5.1)
Sodium: 138 meq/L (ref 135–145)
Total Bilirubin: 0.4 mg/dL (ref 0.2–1.2)
Total Protein: 7.4 g/dL (ref 6.0–8.3)

## 2023-07-23 LAB — LIPID PANEL
Cholesterol: 121 mg/dL (ref 0–200)
HDL: 55 mg/dL (ref >39.00)
LDL Cholesterol: 43 mg/dL (ref 0–99)
NonHDL: 66.45
Total CHOL/HDL Ratio: 2
Triglycerides: 119 mg/dL (ref 0.0–149.0)
VLDL: 23.8 mg/dL (ref 0.0–40.0)

## 2023-07-23 LAB — SEDIMENTATION RATE: Sed Rate: 15 mm/h (ref 0–30)

## 2023-07-23 LAB — C-REACTIVE PROTEIN: CRP: 1.6 mg/dL (ref 0.5–20.0)

## 2023-07-23 LAB — TSH: TSH: 1.31 u[IU]/mL (ref 0.35–5.50)

## 2023-07-23 NOTE — Progress Notes (Signed)
 Subjective:    Patient ID: Mia Thomas, female    DOB: [birth year], 54 y.o.   MRN: [id number]  HPI Patient presents for yearly preventative medicine examination. She is a pleasant 53 year old female who  has a past medical history of Allergy, Anxiety, Asthma, Depression, Scoliosis, Shingles, and Stevens-Johnson syndrome (HCC).  Insomnia-chronic in nature. She is currently prescribed Klonopin  2 mg at bedtime PRN   Asthma-takes Singulair  daily.  She does feel as though her symptoms are well controlled  Glucose Intolerance - has single A1c of 6.5 in 08/2019. She does eat healthy and stays active.   Peri Menopausal symptoms - prescribed Lo Lo Estrin FE by her GYN. 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  Her autoimmune workup was negative.  She is seen by rheumatology on a routine basis.  He does have ongoing pain and discomfort in multiple joints but has not had any reoccurrence of rash in quite some time.  She has pain and stiffness in her bilateral hands, hips, knees as well as stiffness in her neck and low back.  Has not wanted to start any oral medication. She does stay active with walking and swimming. She also does cold plunges and finds this beneficial for her joint pain.   Unintentional weight loss -started around December 2024 when she has had a slow and steady weight loss.  He has not changed her diet, usually eats 1 meal a day and eats a lot of fruits and vegetables and some fish.  She does not feel acutely ill.  She is under a lot of stress with selling her mother's home after her mom moved into an independent living facility.  She denies any GI issues, chest pain, or shortness of breath.  A few months prior to when her weight loss started she came off Lexapro . She had a normal colonoscopy in 10/2022.  Wt Readings from Last 10 Encounters:  07/23/23 195 lb (88.5 kg)  07/14/23 197 lb (89.4 kg)  01/10/23 213 lb (96.6 kg)   12/25/22 214 lb (97.1 kg)  12/23/22 214 lb (97.1 kg)  11/22/22 218 lb (98.9 kg)  11/12/22 219 lb (99.3 kg)  10/28/22 223 lb (101.2 kg)  07/23/22 223 lb (101.2 kg)  06/13/22 223 lb (101.2 kg)    All immunizations and health maintenance protocols were reviewed with the patient and needed orders were placed.  Appropriate screening laboratory values were ordered for the patient including screening of hyperlipidemia, renal function and hepatic function.  Medication reconciliation,  past medical history, social history, problem list and allergies were reviewed in detail with the patient  Goals were established with regard to weight loss, exercise, and  diet in compliance with medications  She is up to date on colon cancer screening, mammograms and GYN care  Review of Systems  Constitutional: Negative.   HENT: Negative.    Eyes: Negative.   Respiratory: Negative.    Cardiovascular: Negative.   Gastrointestinal: Negative.   Endocrine: Negative.   Genitourinary: Negative.   Musculoskeletal:  Positive for arthralgias, back pain, myalgias, neck pain and neck stiffness.  Skin: Negative.   Allergic/Immunologic: Negative.   Neurological: Negative.   Hematological: Negative.   Psychiatric/Behavioral:  Positive for sleep disturbance. The patient is nervous/anxious.    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   Originally from [place] , and moved to [place] from [place] in 2016.   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 She is considering what else she will do with her time as her children are much less dependent on her.   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   Social Drivers of Corporate investment banker Strain: Not on file  Food Insecurity: Not on file  Transportation Needs: Not on file  Physical Activity: Not on file  Stress: Not on file  Social Connections: Not on file  Intimate Partner Violence: Not on file    Past Surgical History:  Procedure Laterality Date   CESAREAN SECTION     CHOLECYSTECTOMY     CHOLECYSTECTOMY     EYE SURGERY     repair corneal tare   knee arthroscopy x 2     Left x 2; right x 1   WISDOM TOOTH EXTRACTION      Family History  Problem Relation Age of Onset   Cancer Mother        skin   Atrial fibrillation Mother    Cancer Father        skin   Heart disease Father    Asthma Father    Rheum arthritis Father    Bladder Cancer Father    Prostate cancer Father    Other Father        shingles   Cancer Sister        cluster cancers   Hypertension Sister    Glaucoma Sister    Hypertension Brother    Rheum arthritis  Paternal Aunt    Rheum arthritis Paternal Aunt    Rheum arthritis Paternal Education officer, community    Healthy Daughter    Healthy Son    Rheum arthritis Cousin    Colon cancer Neg Hx    Esophageal cancer Neg Hx    Rectal cancer Neg Hx    Stomach cancer Neg Hx     Allergies  Allergen Reactions   Sulfa Antibiotics     Landon Pinion Johnson"s syndrome   Gramineae Pollens Other (See Comments)    Current Outpatient Medications on File Prior to Visit  Medication Sig Dispense Refill   clonazePAM  (KLONOPIN ) 2 MG tablet Take 1 tablet (2 mg total) by mouth at bedtime. 30 tablet 1   montelukast  (SINGULAIR ) 10 MG tablet TAKE 1 TABLET BY MOUTH AT BEDTIME 90 tablet 3   Norethindrone-Ethinyl Estradiol -Fe Biphas (LO LOESTRIN FE ) 1 MG-10 MCG / 10 MCG tablet Take 1 tablet by mouth daily. 84 tablet 3   Omega-3 Fatty Acids (FISH OIL PO) Take by mouth.     TART CHERRY PO Take by mouth.     traZODone  (DESYREL ) 100 MG tablet Take 1 tablet (100 mg total) by mouth at bedtime. Take as needed. 90 tablet 3   TURMERIC PO Take by mouth.     Current Facility-Administered Medications on File Prior to Visit  Medication Dose Route Frequency Provider Last Rate Last Admin   sodium chloride  (PF) 0.9 % injection 2 mL  2 mL Intravenous PRN         BP 110/70   Pulse (!) 53   Temp 98.5 F (36.9 C) (Oral)   Ht 5\' 6"  (1.676 m)   Wt 195 lb (88.5 kg)   LMP 06/27/2023 (Exact Date) Comment: Has started having periods again, has one in [place] then another April.  SpO2 97%   BMI 31.47 kg/m       Objective:   Physical Exam Vitals and nursing note reviewed.  Constitutional:      General: She is not in acute distress.    Appearance: Normal appearance. She is not ill-appearing.  HENT:     Head: Normocephalic and atraumatic.     Right Ear: Tympanic membrane, ear canal and external ear normal. There is no impacted cerumen.     Left Ear: Tympanic membrane, ear canal and external ear normal. There is no impacted cerumen.     Nose: Nose  normal. No congestion or rhinorrhea.     Mouth/Throat:     Mouth: Mucous membranes are moist.     Pharynx: Oropharynx is clear.  Eyes:     Extraocular Movements: Extraocular movements intact.     Conjunctiva/sclera: Conjunctivae normal.     Pupils: Pupils are equal, round, and reactive to light.  Neck:     Vascular: No carotid bruit.  Cardiovascular:     Rate and Rhythm: Normal rate and regular rhythm.     Pulses: Normal pulses.     Heart sounds: No murmur heard.    No friction rub. No gallop.  Pulmonary:     Effort: Pulmonary effort is normal.     Breath sounds: Normal breath sounds.  Abdominal:     General: Abdomen is flat. Bowel sounds are normal. There is no distension.     Palpations: Abdomen is soft. There is no mass.     Tenderness: There is no abdominal tenderness. There is no guarding or rebound.     Hernia: No hernia is present.  Musculoskeletal:        General: Normal range of motion.     Cervical back: Normal range of motion and neck supple.  Lymphadenopathy:     Cervical: No cervical adenopathy.  Skin:    General: Skin is warm and dry.     Capillary Refill: Capillary refill takes less than 2 seconds.  Neurological:     General: No focal deficit present.     Mental Status: She is alert and oriented to person, place, and time.  Psychiatric:        Mood and Affect: Mood normal.  Behavior: Behavior normal.        Thought Content: Thought content normal.        Judgment: Judgment normal.        Assessment & Plan:  1. Routine general medical examination at a health care facility (Primary) Today patient counseled on age appropriate routine health concerns for screening and prevention, each reviewed and up to date or declined. Immunizations reviewed and up to date or declined. Labs ordered and reviewed. Risk factors for depression reviewed and negative. Hearing function and visual acuity are intact. ADLs screened and addressed as needed. Functional ability and  level of safety reviewed and appropriate. Education, counseling and referrals performed based on assessed risks today. Patient provided with a copy of personalized plan for preventive services. - Continue to eat healthy and exercise - Follow up in one year or sooner if  needed  2. Insomnia, unspecified type - Can use Klonopin  as needed - CBC with Differential/Platelet; Future - Comprehensive metabolic panel with GFR; Future - Lipid panel; Future - TSH; Future - Hemoglobin A1c; Future - Hemoglobin A1c - TSH - Lipid panel - Comprehensive metabolic panel with GFR - CBC with Differential/Platelet  3. Glucose intolerance - Consider metformin  - CBC with Differential/Platelet; Future - Comprehensive metabolic panel with GFR; Future - Lipid panel; Future - TSH; Future - Hemoglobin A1c; Future - Hemoglobin A1c - TSH - Lipid panel - Comprehensive metabolic panel with GFR - CBC with Differential/Platelet  4. Perimenopausal symptoms - Continue with OCPs - Follow up with Gyn as directed  5. Lupus erythematosus tumidus - Per Rheumatology  - CBC with Differential/Platelet; Future - Comprehensive metabolic panel with GFR; Future - Lipid panel; Future - TSH; Future - Hemoglobin A1c; Future - Hemoglobin A1c - TSH - Lipid panel - Comprehensive metabolic panel with GFR - CBC with Differential/Platelet  6. Unintentional weight loss - Possibly multifactorial with stress and anxiety as well as no longer on SSRI.  - Will check labs and xray today and consider CT chest/abd/pelvis - CBC with Differential/Platelet; Future - Comprehensive metabolic panel with GFR; Future - Lipid panel; Future - TSH; Future - Hemoglobin A1c; Future - C-reactive Protein - Sedimentation Rate; Future - Hep C Antibody; Future - DG Chest 2 View; Future - Hep C Antibody - Sedimentation Rate - Hemoglobin A1c - TSH - Lipid panel - Comprehensive metabolic panel with GFR - CBC with  Differential/Platelet  Alto Atta, NP

## 2023-07-24 LAB — HEPATITIS C ANTIBODY: Hepatitis C Ab: NONREACTIVE

## 2023-08-26 ENCOUNTER — Other Ambulatory Visit (HOSPITAL_COMMUNITY): Payer: Self-pay

## 2023-08-27 ENCOUNTER — Other Ambulatory Visit (HOSPITAL_COMMUNITY): Payer: Self-pay

## 2023-09-09 ENCOUNTER — Telehealth (INDEPENDENT_AMBULATORY_CARE_PROVIDER_SITE_OTHER): Admitting: Adult Health

## 2023-09-09 ENCOUNTER — Other Ambulatory Visit (HOSPITAL_COMMUNITY): Payer: Self-pay

## 2023-09-09 ENCOUNTER — Encounter: Payer: Self-pay | Admitting: Adult Health

## 2023-09-09 VITALS — Ht 66.0 in | Wt 195.0 lb

## 2023-09-09 DIAGNOSIS — R6884 Jaw pain: Secondary | ICD-10-CM | POA: Diagnosis not present

## 2023-09-09 DIAGNOSIS — M5442 Lumbago with sciatica, left side: Secondary | ICD-10-CM | POA: Diagnosis not present

## 2023-09-09 DIAGNOSIS — M25552 Pain in left hip: Secondary | ICD-10-CM

## 2023-09-09 DIAGNOSIS — M79632 Pain in left forearm: Secondary | ICD-10-CM

## 2023-09-09 DIAGNOSIS — M5412 Radiculopathy, cervical region: Secondary | ICD-10-CM | POA: Diagnosis not present

## 2023-09-09 MED ORDER — METHYLPREDNISOLONE 4 MG PO TBPK
ORAL_TABLET | ORAL | 0 refills | Status: DC
  Filled 2023-09-09: qty 21, 6d supply, fill #0

## 2023-09-09 MED ORDER — METHOCARBAMOL 750 MG PO TABS
750.0000 mg | ORAL_TABLET | Freq: Three times a day (TID) | ORAL | 0 refills | Status: DC | PRN
  Filled 2023-09-09: qty 30, 10d supply, fill #0

## 2023-09-09 NOTE — Progress Notes (Addendum)
 Virtual Visit via Video Note  I connected with Mia Thomas  on 09/09/23 at  5:00 PM EDT by a video enabled telemedicine application and verified that I am speaking with the correct person using two identifiers.  Location patient: home Location provider:work or home office Persons participating in the virtual visit: patient, provider  I discussed the limitations of evaluation and management by telemedicine and the availability of in person appointments. The patient expressed understanding and agreed to proceed.   HPI: She is being evaluated today for an acute issue of mechanical fall a week ago. She reports that she had water on the floor of her bathroom and when she walked into her bathroom she slipped and had a pretty hard fall landing on her left side. Over the course of the week she has felt different injuries including left-sided jaw pain with a clicking sensation when she opens and closes her mouth, left forearm pain, numbness and tingling in her 4th and 5th fingers of her left hand, neck pain, left-sided hip and sciatic pain as well as a popping sensation in her lower back and hip.  She also feels fatigued and has to take an afternoon nap.  She denies hitting her head but believes that when she fell her jaw hit her shoulder pretty hard.  At home she has been using naproxen 2 tabs at nighttime which helps take the edge off so she can sleep.  Bending, twisting, reaching, and walking causes low back and sciatic pain.   ROS: See pertinent positives and negatives per HPI.  Past Medical History:  Diagnosis Date   Allergy    Anxiety    Asthma    Depression    Scoliosis    Shingles    Stevens-Johnson syndrome (HCC)    as a child     Past Surgical History:  Procedure Laterality Date   CESAREAN SECTION     CHOLECYSTECTOMY     CHOLECYSTECTOMY     EYE SURGERY     repair corneal tare   knee arthroscopy x 2     Left x 2; right x 1   WISDOM TOOTH EXTRACTION      Family History   Problem Relation Age of Onset   Cancer Mother        skin   Atrial fibrillation Mother    Cancer Father        skin   Heart disease Father    Asthma Father    Rheum arthritis Father    Bladder Cancer Father    Prostate cancer Father    Other Father        shingles   Cancer Sister        cluster cancers   Hypertension Sister    Glaucoma Sister    Hypertension Brother    Rheum arthritis Paternal Aunt    Rheum arthritis Paternal Aunt    Rheum arthritis Paternal Uncle    Healthy Daughter    Healthy Son    Rheum arthritis Cousin    Colon cancer Neg Hx    Esophageal cancer Neg Hx    Rectal cancer Neg Hx    Stomach cancer Neg Hx        Current Outpatient Medications:    clonazePAM  (KLONOPIN ) 2 MG tablet, Take 1 tablet (2 mg total) by mouth at bedtime., Disp: 30 tablet, Rfl: 1   montelukast  (SINGULAIR ) 10 MG tablet, TAKE 1 TABLET BY MOUTH AT BEDTIME, Disp: 90 tablet, Rfl: 3   Norethindrone-Ethinyl  Estradiol -Fe Biphas (LO LOESTRIN FE ) 1 MG-10 MCG / 10 MCG tablet, Take 1 tablet by mouth daily., Disp: 84 tablet, Rfl: 3   Omega-3 Fatty Acids (FISH OIL PO), Take by mouth., Disp: , Rfl:    TART CHERRY PO, Take by mouth., Disp: , Rfl:    traZODone  (DESYREL ) 100 MG tablet, Take 1 tablet (100 mg total) by mouth at bedtime. Take as needed., Disp: 90 tablet, Rfl: 3   TURMERIC PO, Take by mouth., Disp: , Rfl:   Current Facility-Administered Medications:    sodium chloride  (PF) 0.9 % injection 2 mL, 2 mL, Intravenous, PRN,   EXAM:  VITALS per patient if applicable:  GENERAL: alert, oriented, appears well and in no acute distress  HEENT: atraumatic, conjunttiva clear, no obvious abnormalities on inspection of external nose and ears  NECK: normal movements of the head and neck  LUNGS: on inspection no signs of respiratory distress, breathing rate appears normal, no obvious gross SOB, gasping or wheezing  CV: no obvious cyanosis  MS: moves all visible extremities without  noticeable abnormality  PSYCH/NEURO: pleasant and cooperative, no obvious depression or anxiety, speech and thought processing grossly intact  ASSESSMENT AND PLAN:  Discussed the following assessment and plan:  1. Jaw pain (Primary) - Likely TMJ after fall.  - methocarbamol  (ROBAXIN ) 750 MG tablet; Take 1 tablet (750 mg total) by mouth every 8 (eight) hours as needed for muscle spasms.  Dispense: 30 tablet; Refill: 0 - methylPREDNISolone  (MEDROL  DOSEPAK) 4 MG TBPK tablet; Take as directed  Dispense: 21 tablet; Refill: 0  2. Cervical radiculopathy - Consider MRI  - DG Cervical Spine Complete; Future - methocarbamol  (ROBAXIN ) 750 MG tablet; Take 1 tablet (750 mg total) by mouth every 8 (eight) hours as needed for muscle spasms.  Dispense: 30 tablet; Refill: 0 - methylPREDNISolone  (MEDROL  DOSEPAK) 4 MG TBPK tablet; Take as directed  Dispense: 21 tablet; Refill: 0  3. Left forearm pain  - DG Forearm Left; Future - methocarbamol  (ROBAXIN ) 750 MG tablet; Take 1 tablet (750 mg total) by mouth every 8 (eight) hours as needed for muscle spasms.  Dispense: 30 tablet; Refill: 0 - methylPREDNISolone  (MEDROL  DOSEPAK) 4 MG TBPK tablet; Take as directed  Dispense: 21 tablet; Refill: 0  4. Left hip pain - Consider MRI to look for labral tear  - DG Hip Unilat W OR W/O Pelvis 2-3 Views Left; Future - methocarbamol  (ROBAXIN ) 750 MG tablet; Take 1 tablet (750 mg total) by mouth every 8 (eight) hours as needed for muscle spasms.  Dispense: 30 tablet; Refill: 0 - methylPREDNISolone  (MEDROL  DOSEPAK) 4 MG TBPK tablet; Take as directed  Dispense: 21 tablet; Refill: 0  5. Acute left-sided low back pain with left-sided sciatica  - DG Lumbar Spine Complete; Future - methocarbamol  (ROBAXIN ) 750 MG tablet; Take 1 tablet (750 mg total) by mouth every 8 (eight) hours as needed for muscle spasms.  Dispense: 30 tablet; Refill: 0 - methylPREDNISolone  (MEDROL  DOSEPAK) 4 MG TBPK tablet; Take as directed  Dispense: 21  tablet; Refill: 0      I discussed the assessment and treatment plan with the patient. The patient was provided an opportunity to ask questions and all were answered. The patient agreed with the plan and demonstrated an understanding of the instructions.   The patient was advised to call back or seek an in-person evaluation if the symptoms worsen or if the condition fails to improve as anticipated.   Roshanda Balazs, NP

## 2023-09-10 ENCOUNTER — Other Ambulatory Visit: Payer: Self-pay | Admitting: Adult Health

## 2023-09-10 ENCOUNTER — Other Ambulatory Visit (HOSPITAL_COMMUNITY): Payer: Self-pay

## 2023-09-10 MED ORDER — DOXYCYCLINE HYCLATE 100 MG PO CAPS
100.0000 mg | ORAL_CAPSULE | Freq: Two times a day (BID) | ORAL | 1 refills | Status: AC
  Filled 2023-09-10: qty 60, 30d supply, fill #0

## 2023-09-11 ENCOUNTER — Ambulatory Visit (INDEPENDENT_AMBULATORY_CARE_PROVIDER_SITE_OTHER)
Admission: RE | Admit: 2023-09-11 | Discharge: 2023-09-11 | Disposition: A | Discharge status: Home / Self Care | Source: Ambulatory Visit | Attending: Adult Health | Admitting: Adult Health

## 2023-09-11 ENCOUNTER — Encounter: Payer: Self-pay | Admitting: Radiology

## 2023-09-11 DIAGNOSIS — M5412 Radiculopathy, cervical region: Secondary | ICD-10-CM

## 2023-09-11 DIAGNOSIS — M5442 Lumbago with sciatica, left side: Secondary | ICD-10-CM | POA: Diagnosis not present

## 2023-09-11 DIAGNOSIS — M79632 Pain in left forearm: Secondary | ICD-10-CM

## 2023-09-11 DIAGNOSIS — M25552 Pain in left hip: Secondary | ICD-10-CM

## 2023-09-11 DIAGNOSIS — M545 Low back pain, unspecified: Secondary | ICD-10-CM | POA: Diagnosis not present

## 2023-09-11 DIAGNOSIS — M48061 Spinal stenosis, lumbar region without neurogenic claudication: Secondary | ICD-10-CM | POA: Diagnosis not present

## 2023-09-11 DIAGNOSIS — M4802 Spinal stenosis, cervical region: Secondary | ICD-10-CM | POA: Diagnosis not present

## 2023-09-11 DIAGNOSIS — M5117 Intervertebral disc disorders with radiculopathy, lumbosacral region: Secondary | ICD-10-CM | POA: Diagnosis not present

## 2023-09-26 ENCOUNTER — Encounter: Payer: Self-pay | Admitting: Obstetrics and Gynecology

## 2023-09-26 DIAGNOSIS — M5442 Lumbago with sciatica, left side: Secondary | ICD-10-CM

## 2023-09-26 NOTE — Telephone Encounter (Signed)
 Spoke to Mia Thomas and she is still experiencing significant sciatica pain after her fall. I am going to refer her to PT

## 2023-09-26 NOTE — Telephone Encounter (Signed)
 Last AEX 07/14/23 -routing to Dr. Nikki to review.

## 2023-09-30 ENCOUNTER — Other Ambulatory Visit: Payer: Self-pay | Admitting: Obstetrics and Gynecology

## 2023-09-30 ENCOUNTER — Other Ambulatory Visit (HOSPITAL_COMMUNITY): Payer: Self-pay

## 2023-09-30 MED ORDER — LO LOESTRIN FE 1 MG-10 MCG / 10 MCG PO TABS
1.0000 | ORAL_TABLET | Freq: Every day | ORAL | 3 refills | Status: AC
  Filled 2023-09-30: qty 112, 112d supply, fill #0
  Filled 2023-10-02: qty 112, 84d supply, fill #0
  Filled 2023-12-08: qty 112, 112d supply, fill #0
  Filled 2023-12-11: qty 28, 28d supply, fill #0
  Filled 2023-12-11 (×2): qty 84, 72d supply, fill #1
  Filled 2023-12-11: qty 84, 72d supply, fill #0
  Filled 2024-02-15: qty 84, 72d supply, fill #1

## 2023-09-30 NOTE — Progress Notes (Signed)
 New Rx to take LoLoestrin norethindrone/estradiol  pills continuously and skip the estradiol  and iron pills in each pack.  Disp:  112, RF 3.

## 2023-10-02 ENCOUNTER — Other Ambulatory Visit: Payer: Self-pay | Admitting: Adult Health

## 2023-10-02 ENCOUNTER — Other Ambulatory Visit (HOSPITAL_COMMUNITY): Payer: Self-pay

## 2023-10-02 ENCOUNTER — Ambulatory Visit

## 2023-10-02 MED ORDER — FLUCONAZOLE 150 MG PO TABS
ORAL_TABLET | ORAL | 1 refills | Status: AC
  Filled 2023-10-02: qty 10, 30d supply, fill #0

## 2023-11-01 ENCOUNTER — Encounter: Payer: Self-pay | Admitting: Obstetrics and Gynecology

## 2023-11-04 ENCOUNTER — Other Ambulatory Visit (HOSPITAL_COMMUNITY): Payer: Self-pay

## 2023-11-04 ENCOUNTER — Other Ambulatory Visit: Payer: Self-pay | Admitting: Nurse Practitioner

## 2023-11-04 DIAGNOSIS — N952 Postmenopausal atrophic vaginitis: Secondary | ICD-10-CM

## 2023-11-04 MED ORDER — ESTRADIOL 0.1 MG/GM VA CREA
TOPICAL_CREAM | VAGINAL | 0 refills | Status: AC
  Filled 2023-11-04: qty 42.5, 90d supply, fill #0

## 2023-11-17 ENCOUNTER — Other Ambulatory Visit: Payer: Self-pay | Admitting: Adult Health

## 2023-11-17 ENCOUNTER — Encounter: Payer: Self-pay | Admitting: Obstetrics and Gynecology

## 2023-11-17 ENCOUNTER — Other Ambulatory Visit (HOSPITAL_COMMUNITY): Payer: Self-pay

## 2023-11-17 DIAGNOSIS — G4700 Insomnia, unspecified: Secondary | ICD-10-CM

## 2023-11-17 DIAGNOSIS — J45909 Unspecified asthma, uncomplicated: Secondary | ICD-10-CM

## 2023-11-17 MED ORDER — NORETHIN-ETH ESTRAD-FE BIPHAS 1 MG-10 MCG / 10 MCG PO TABS
1.0000 | ORAL_TABLET | Freq: Every day | ORAL | 0 refills | Status: AC
  Filled 2023-11-17: qty 28, 28d supply, fill #0

## 2023-11-17 NOTE — Telephone Encounter (Signed)
 Rx pended for 30 day supply.   Routing to Dr. Nikki.

## 2023-11-19 ENCOUNTER — Other Ambulatory Visit (HOSPITAL_COMMUNITY): Payer: Self-pay

## 2023-11-19 MED ORDER — MONTELUKAST SODIUM 10 MG PO TABS
ORAL_TABLET | Freq: Every day | ORAL | 3 refills | Status: AC
  Filled 2023-11-19: qty 90, 90d supply, fill #0
  Filled 2024-02-11: qty 90, 90d supply, fill #1

## 2023-11-19 MED ORDER — TRAZODONE HCL 100 MG PO TABS
100.0000 mg | ORAL_TABLET | Freq: Every day | ORAL | 3 refills | Status: AC
  Filled 2023-11-19: qty 90, 90d supply, fill #0

## 2023-11-21 ENCOUNTER — Other Ambulatory Visit (HOSPITAL_COMMUNITY): Payer: Self-pay

## 2023-11-21 DIAGNOSIS — L570 Actinic keratosis: Secondary | ICD-10-CM | POA: Diagnosis not present

## 2023-11-21 DIAGNOSIS — L814 Other melanin hyperpigmentation: Secondary | ICD-10-CM | POA: Diagnosis not present

## 2023-11-21 DIAGNOSIS — L932 Other local lupus erythematosus: Secondary | ICD-10-CM | POA: Diagnosis not present

## 2023-11-21 MED ORDER — FLUOROURACIL 5 % EX CREA
1.0000 | TOPICAL_CREAM | Freq: Two times a day (BID) | CUTANEOUS | 0 refills | Status: AC
  Filled 2023-11-21: qty 40, 14d supply, fill #0

## 2023-11-22 ENCOUNTER — Other Ambulatory Visit (HOSPITAL_COMMUNITY): Payer: Self-pay

## 2023-12-01 ENCOUNTER — Other Ambulatory Visit (HOSPITAL_BASED_OUTPATIENT_CLINIC_OR_DEPARTMENT_OTHER): Payer: Self-pay

## 2023-12-01 MED ORDER — FLUZONE 0.5 ML IM SUSY
0.5000 mL | PREFILLED_SYRINGE | Freq: Once | INTRAMUSCULAR | 0 refills | Status: AC
Start: 2023-12-01 — End: 2023-12-02
  Filled 2023-12-01: qty 0.5, 1d supply, fill #0

## 2023-12-01 MED ORDER — COMIRNATY 30 MCG/0.3ML IM SUSY
0.3000 mL | PREFILLED_SYRINGE | Freq: Once | INTRAMUSCULAR | 0 refills | Status: AC
Start: 2023-12-01 — End: 2023-12-02
  Filled 2023-12-01: qty 0.3, 1d supply, fill #0

## 2023-12-08 ENCOUNTER — Other Ambulatory Visit (HOSPITAL_COMMUNITY): Payer: Self-pay

## 2023-12-11 ENCOUNTER — Other Ambulatory Visit (HOSPITAL_COMMUNITY): Payer: Self-pay

## 2023-12-29 ENCOUNTER — Other Ambulatory Visit: Payer: Self-pay

## 2024-02-11 ENCOUNTER — Other Ambulatory Visit (HOSPITAL_COMMUNITY): Payer: Self-pay

## 2024-02-16 ENCOUNTER — Other Ambulatory Visit (HOSPITAL_COMMUNITY): Payer: Self-pay

## 2024-02-27 ENCOUNTER — Other Ambulatory Visit (HOSPITAL_COMMUNITY): Payer: Self-pay

## 2024-02-27 MED ORDER — AZITHROMYCIN 250 MG PO TABS
ORAL_TABLET | ORAL | 0 refills | Status: AC
  Filled 2024-02-27: qty 6, 5d supply, fill #0

## 2024-07-15 ENCOUNTER — Ambulatory Visit: Admitting: Obstetrics and Gynecology

## 2024-08-02 ENCOUNTER — Ambulatory Visit: Admitting: Obstetrics and Gynecology
